# Patient Record
Sex: Female | Born: 1935 | Race: White | Hispanic: No | Marital: Single
Health system: Southern US, Community
[De-identification: ages and names within clinical notes are randomized; demographics above are authoritative.]

---

## 2009-01-04 ENCOUNTER — Inpatient Hospital Stay: Payer: Self-pay | Admitting: Internal Medicine

## 2009-01-14 ENCOUNTER — Inpatient Hospital Stay: Payer: Self-pay | Admitting: Specialist

## 2010-02-07 ENCOUNTER — Ambulatory Visit: Payer: Self-pay | Admitting: Family Medicine

## 2010-05-16 IMAGING — CT CT CHEST W/ CM
1 series · 15 of 32 positions shown, 19 images · non-contrast
Comparison: none

REASON FOR EXAM: Prior left upper lobe collapse with mucous plugging, now
with worsening cp/sob
COMMENTS:

[Series 2: soft tissue · axial · 0.61mm/px · z∈[-386,-140]mm · 15 of 55 slices shown, 19 images]
[im 4/55  soft-tissue]
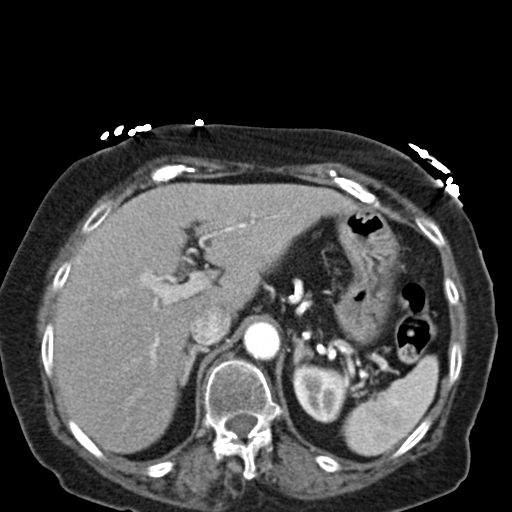
[im 4/55  bone]
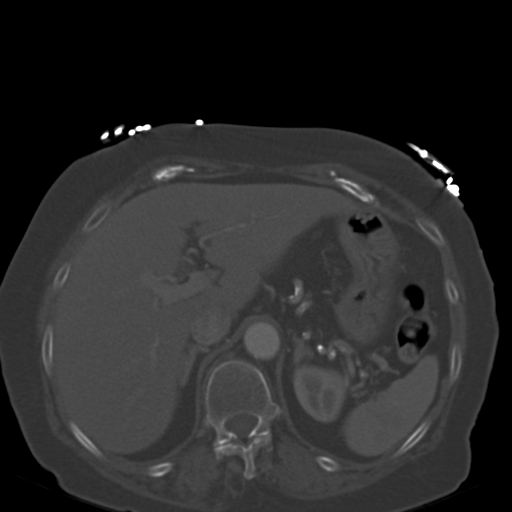
[im 7/55  soft-tissue]
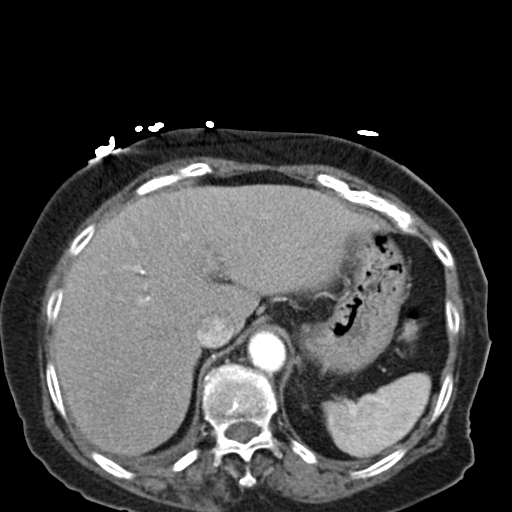
[im 11/55  soft-tissue]
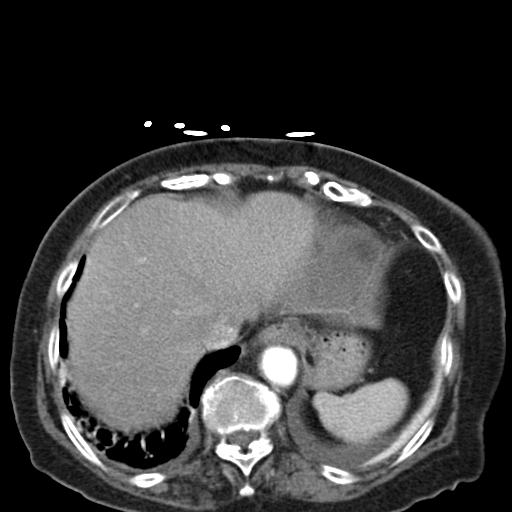
[im 16/55  soft-tissue]
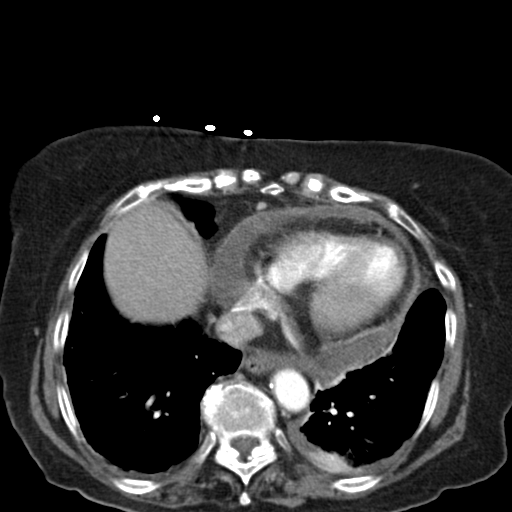
[im 20/55  soft-tissue]
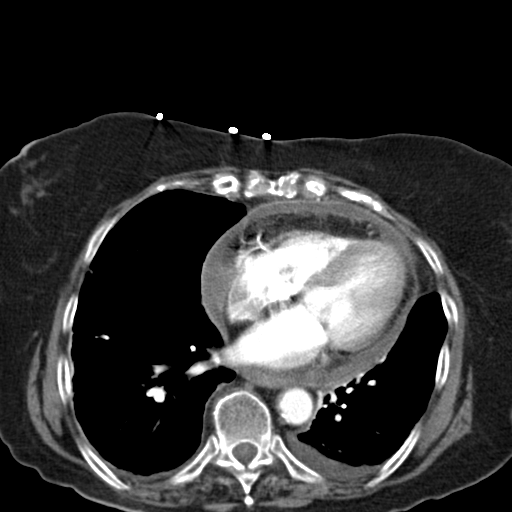
[im 23/55  soft-tissue]
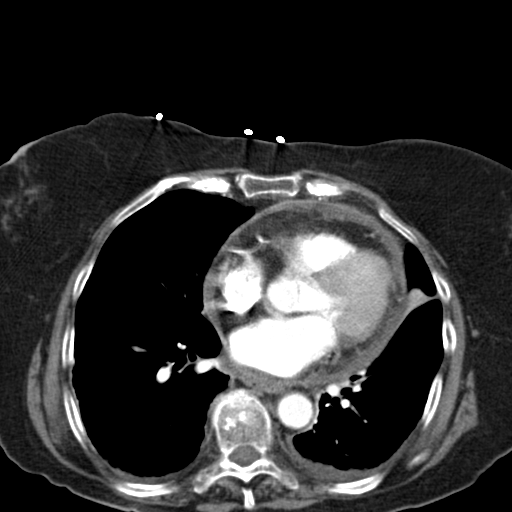
[im 28/55  soft-tissue]
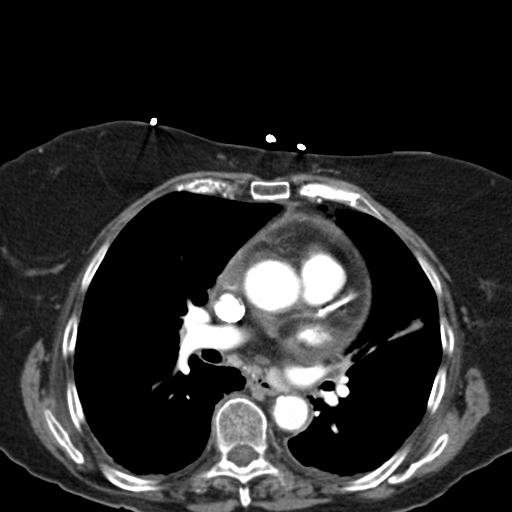
[im 32/55  soft-tissue]
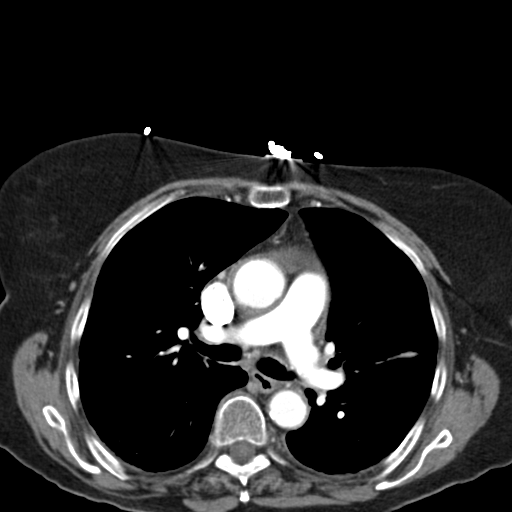
[im 35/55  soft-tissue]
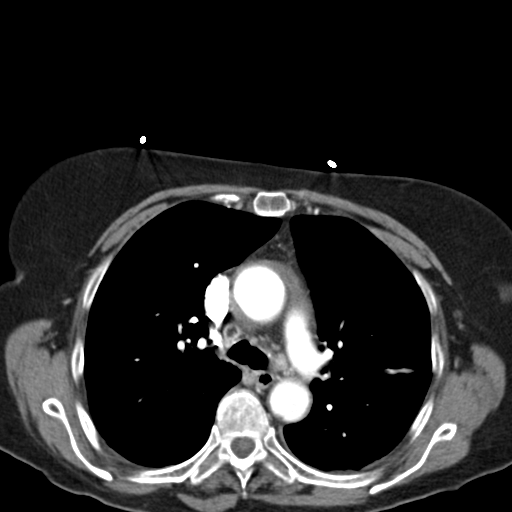
[im 35/55  bone]
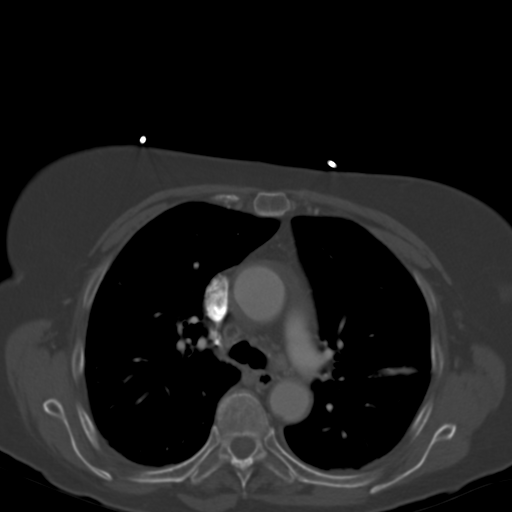
[im 39/55  soft-tissue]
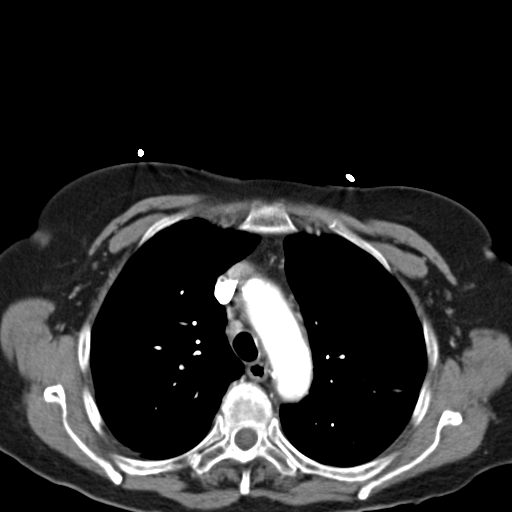
[im 44/55  soft-tissue]
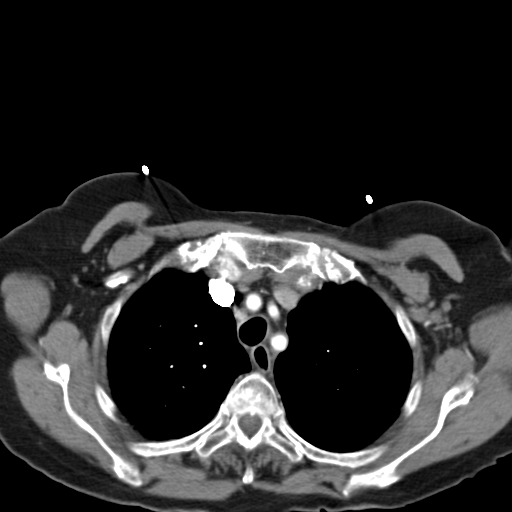
[im 48/55  soft-tissue]
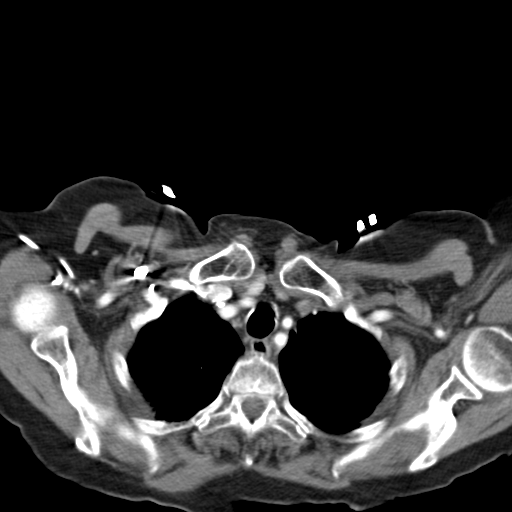
[im 48/55  lung]
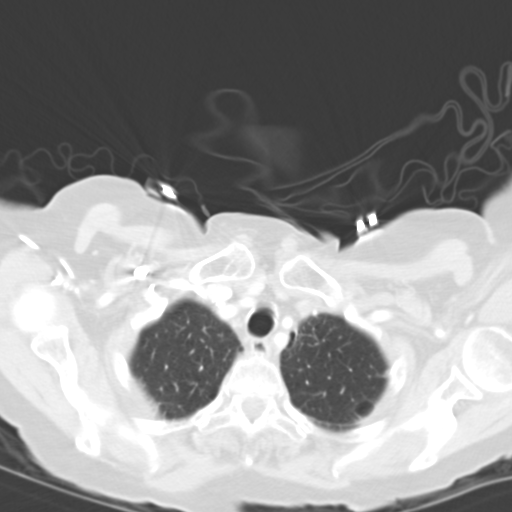
[im 49/55  lung]
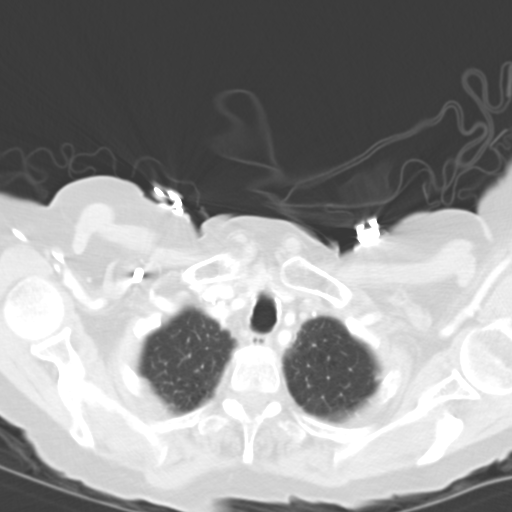
[im 51/55  soft-tissue]
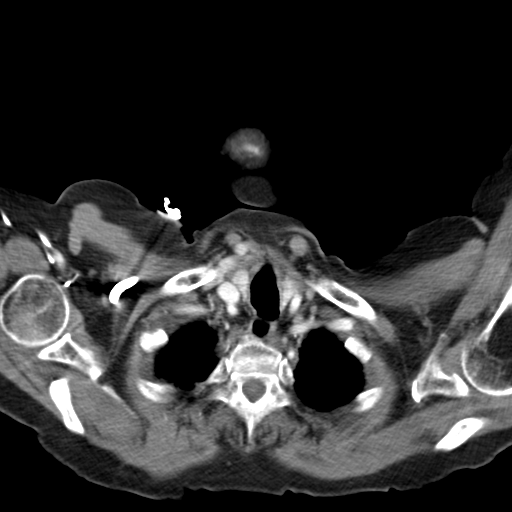
[im 51/55  lung]
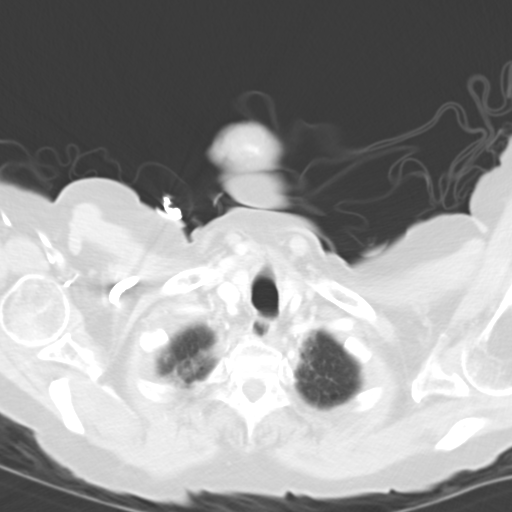
[im 53/55  lung]
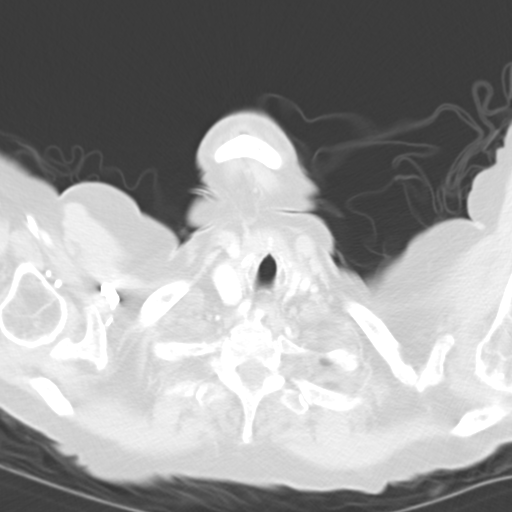

[15 of 32 positions shown; findings below may reference images not displayed]

PROCEDURE:     CT  - CT CHEST WITH CONTRAST  - January 14, 2009  [DATE]

RESULT:     CT of the chest is performed utilizing 75 mL of Psovue-H9N
iodinated intravenous contrast. Images are reconstructed in the axial plane
at 5 mm slice thickness. There is pericardial and left pleural effusion. In
the dependent portion near the left ventricle the pericardial effusion
measures is much as 11.5 mm in thickness. Anteriorly the thickness is a
proximally 6.6 mm in thickness. There some compressive atelectasis in the
left lower lobe. There is a trace pleural effusion on the right. There is no
pulmonary and bullous and demonstrated. There is no aortic dissection. There
is no mediastinal or hilar mass or adenopathy. The previous lingular
atelectasis and left upper lobe atelectasis have resolved.
IMPRESSION: 1. Pericardial effusion. Small left and trace right pleural effusions. Some
areas of compressive atelectasis are present. There is no pulmonary
embolism. There is some residual left upper lobe atelectasis which is
improved since the previous exam.

## 2011-02-10 ENCOUNTER — Ambulatory Visit: Payer: Self-pay | Admitting: Family Medicine

## 2011-06-03 ENCOUNTER — Inpatient Hospital Stay (HOSPITAL_COMMUNITY): Admission: AD | Admit: 2011-06-03 | Payer: Self-pay | Source: Other Acute Inpatient Hospital | Admitting: Neurology

## 2011-06-03 ENCOUNTER — Emergency Department: Payer: Self-pay | Admitting: Unknown Physician Specialty

## 2011-06-07 ENCOUNTER — Encounter: Payer: Self-pay | Admitting: Internal Medicine

## 2011-06-17 ENCOUNTER — Encounter: Payer: Self-pay | Admitting: Internal Medicine

## 2011-07-20 ENCOUNTER — Emergency Department: Payer: Self-pay | Admitting: Emergency Medicine

## 2011-07-21 LAB — URINALYSIS, COMPLETE
Glucose,UR: NEGATIVE mg/dL (ref 0–75)
Nitrite: NEGATIVE
RBC,UR: 1 /HPF (ref 0–5)
Specific Gravity: 1.02 (ref 1.003–1.030)
Squamous Epithelial: 4
WBC UR: 10 /HPF (ref 0–5)

## 2011-07-21 LAB — CBC WITH DIFFERENTIAL/PLATELET
Basophil #: 0 10*3/uL (ref 0.0–0.1)
Basophil %: 0.3 %
HGB: 11.9 g/dL — ABNORMAL LOW (ref 12.0–16.0)
Lymphocyte #: 1.9 10*3/uL (ref 1.0–3.6)
MCV: 98 fL (ref 80–100)
Monocyte %: 7.8 %
Neutrophil #: 8.1 10*3/uL — ABNORMAL HIGH (ref 1.4–6.5)
Platelet: 221 10*3/uL (ref 150–440)
RBC: 3.61 10*6/uL — ABNORMAL LOW (ref 3.80–5.20)
RDW: 14.7 % — ABNORMAL HIGH (ref 11.5–14.5)
WBC: 11.1 10*3/uL — ABNORMAL HIGH (ref 3.6–11.0)

## 2011-07-21 LAB — COMPREHENSIVE METABOLIC PANEL
Alkaline Phosphatase: 61 U/L (ref 50–136)
Anion Gap: 13 (ref 7–16)
Bilirubin,Total: 0.3 mg/dL (ref 0.2–1.0)
Calcium, Total: 9.4 mg/dL (ref 8.5–10.1)
Chloride: 103 mmol/L (ref 98–107)
Co2: 21 mmol/L (ref 21–32)
Creatinine: 0.9 mg/dL (ref 0.60–1.30)
EGFR (African American): >60
EGFR (Non-African Amer.): >60
Osmolality: 279 (ref 275–301)
Sodium: 137 mmol/L (ref 136–145)

## 2011-07-21 LAB — PROTIME-INR
INR: 1
Prothrombin Time: 13.7 secs (ref 11.5–14.7)

## 2011-07-21 LAB — CK TOTAL AND CKMB (NOT AT ARMC): CK-MB: 0.7 ng/mL (ref 0.5–3.6)

## 2011-07-23 ENCOUNTER — Emergency Department: Payer: Self-pay | Admitting: Emergency Medicine

## 2011-07-23 LAB — CBC
HCT: 35.6 % (ref 35.0–47.0)
HGB: 11.9 g/dL — ABNORMAL LOW (ref 12.0–16.0)
MCV: 97 fL (ref 80–100)
RBC: 3.66 10*6/uL — ABNORMAL LOW (ref 3.80–5.20)
RDW: 14.7 % — ABNORMAL HIGH (ref 11.5–14.5)
WBC: 10.8 10*3/uL (ref 3.6–11.0)

## 2011-07-23 LAB — COMPREHENSIVE METABOLIC PANEL
Albumin: 3.8 g/dL (ref 3.4–5.0)
Alkaline Phosphatase: 66 U/L (ref 50–136)
Bilirubin,Total: 0.3 mg/dL (ref 0.2–1.0)
Calcium, Total: 9.3 mg/dL (ref 8.5–10.1)
Co2: 26 mmol/L (ref 21–32)
Creatinine: 0.81 mg/dL (ref 0.60–1.30)
EGFR (African American): >60
Glucose: 106 mg/dL — ABNORMAL HIGH (ref 65–99)
SGOT(AST): 26 U/L (ref 15–37)
SGPT (ALT): 24 U/L
Sodium: 138 mmol/L (ref 136–145)
Total Protein: 6.8 g/dL (ref 6.4–8.2)

## 2011-07-23 LAB — URINALYSIS, COMPLETE
Bilirubin,UR: NEGATIVE
Blood: NEGATIVE
Glucose,UR: NEGATIVE mg/dL (ref 0–75)
Nitrite: NEGATIVE
Protein: 30

## 2011-07-26 ENCOUNTER — Encounter: Payer: Self-pay | Admitting: Internal Medicine

## 2011-07-26 LAB — URINALYSIS, COMPLETE
Blood: NEGATIVE
Glucose,UR: NEGATIVE mg/dL (ref 0–75)
Leukocyte Esterase: NEGATIVE
Nitrite: NEGATIVE
Ph: 5 (ref 4.5–8.0)
Protein: 30
Specific Gravity: 1.026 (ref 1.003–1.030)
WBC UR: 9 /HPF (ref 0–5)

## 2011-07-27 LAB — THEOPHYLLINE LEVEL: Theophylline: 9.3 ug/mL — ABNORMAL LOW (ref 10.0–20.0)

## 2011-07-28 LAB — URINE CULTURE

## 2011-08-14 ENCOUNTER — Ambulatory Visit: Payer: Self-pay | Admitting: Internal Medicine

## 2011-08-18 ENCOUNTER — Encounter: Payer: Self-pay | Admitting: Internal Medicine

## 2012-05-17 ENCOUNTER — Ambulatory Visit: Payer: Self-pay | Admitting: Internal Medicine

## 2012-06-01 ENCOUNTER — Inpatient Hospital Stay: Payer: Self-pay | Admitting: Family Medicine

## 2012-06-01 LAB — CBC WITH DIFFERENTIAL/PLATELET
Basophil #: 0.1 10*3/uL (ref 0.0–0.1)
Basophil %: 0.5 %
Eosinophil #: 0.1 10*3/uL (ref 0.0–0.7)
Eosinophil %: 0.2 %
Eosinophil %: 0.2 %
HCT: 34.5 % — ABNORMAL LOW (ref 35.0–47.0)
HGB: 14 g/dL (ref 12.0–16.0)
Lymphocyte #: 5 10*3/uL — ABNORMAL HIGH (ref 1.0–3.6)
Lymphocyte #: 0.7 10*3/uL — ABNORMAL LOW (ref 1.0–3.6)
Lymphocyte %: 4.4 %
MCH: 30.2 pg (ref 26.0–34.0)
MCV: 91 fL (ref 80–100)
Monocyte #: 1.3 x10 3/mm — ABNORMAL HIGH (ref 0.2–0.9)
Monocyte #: 1.4 x10 3/mm — ABNORMAL HIGH (ref 0.2–0.9)
Monocyte %: 8.2 %
Neutrophil %: 72.8 %
Neutrophil %: 86.8 %
Platelet: 306 10*3/uL (ref 150–440)
Platelet: 229 10*3/uL (ref 150–440)
RBC: 4.65 10*6/uL (ref 3.80–5.20)
RBC: 3.81 10*6/uL (ref 3.80–5.20)
RDW: 17 % — ABNORMAL HIGH (ref 11.5–14.5)
WBC: 16.8 10*3/uL — ABNORMAL HIGH (ref 3.6–11.0)

## 2012-06-01 LAB — URINALYSIS, COMPLETE
Ph: 6 (ref 4.5–8.0)
Protein: 100
RBC,UR: 3 /HPF (ref 0–5)
Squamous Epithelial: <1
WBC UR: 5 /HPF (ref 0–5)

## 2012-06-01 LAB — COMPREHENSIVE METABOLIC PANEL
Albumin: 4.6 g/dL (ref 3.4–5.0)
Albumin: 3.4 g/dL (ref 3.4–5.0)
BUN: 21 mg/dL — ABNORMAL HIGH (ref 7–18)
Bilirubin,Total: 0.5 mg/dL (ref 0.2–1.0)
Bilirubin,Total: 0.5 mg/dL (ref 0.2–1.0)
Chloride: 105 mmol/L (ref 98–107)
Co2: 21 mmol/L (ref 21–32)
Co2: 22 mmol/L (ref 21–32)
Creatinine: 0.86 mg/dL (ref 0.60–1.30)
Creatinine: 1.32 mg/dL — ABNORMAL HIGH (ref 0.60–1.30)
EGFR (African American): >60
EGFR (African American): 43 — ABNORMAL LOW
EGFR (Non-African Amer.): >60
EGFR (Non-African Amer.): 37 — ABNORMAL LOW
Glucose: 139 mg/dL — ABNORMAL HIGH (ref 65–99)
Osmolality: 289 (ref 275–301)
Osmolality: 277 (ref 275–301)
Potassium: 2.8 mmol/L — ABNORMAL LOW (ref 3.5–5.1)
SGOT(AST): 21 U/L (ref 15–37)
Sodium: 141 mmol/L (ref 136–145)
Sodium: 136 mmol/L (ref 136–145)
Total Protein: 6.7 g/dL (ref 6.4–8.2)

## 2012-06-01 LAB — TROPONIN I
Troponin-I: 0.08 ng/mL — ABNORMAL HIGH
Troponin-I: 0.1 ng/mL — ABNORMAL HIGH

## 2012-06-01 LAB — CK TOTAL AND CKMB (NOT AT ARMC): CK-MB: 2 ng/mL (ref 0.5–3.6)

## 2012-06-02 DIAGNOSIS — I10 Essential (primary) hypertension: Secondary | ICD-10-CM

## 2012-06-02 DIAGNOSIS — I4891 Unspecified atrial fibrillation: Secondary | ICD-10-CM

## 2012-06-02 LAB — BASIC METABOLIC PANEL
Anion Gap: 11 (ref 7–16)
BUN: 25 mg/dL — ABNORMAL HIGH (ref 7–18)
Chloride: 107 mmol/L (ref 98–107)
Creatinine: 1.76 mg/dL — ABNORMAL HIGH (ref 0.60–1.30)
EGFR (African American): 30 — ABNORMAL LOW
EGFR (Non-African Amer.): 26 — ABNORMAL LOW
Glucose: 137 mg/dL — ABNORMAL HIGH (ref 65–99)
Sodium: 138 mmol/L (ref 136–145)

## 2012-06-02 LAB — CBC WITH DIFFERENTIAL/PLATELET
Basophil #: 0.1 10*3/uL (ref 0.0–0.1)
Eosinophil #: 0 10*3/uL (ref 0.0–0.7)
Eosinophil %: 0.1 %
HCT: 31.7 % — ABNORMAL LOW (ref 35.0–47.0)
Lymphocyte #: 0.5 10*3/uL — ABNORMAL LOW (ref 1.0–3.6)
Lymphocyte %: 2.8 %
MCH: 29.8 pg (ref 26.0–34.0)
MCHC: 32.8 g/dL (ref 32.0–36.0)
Monocyte #: 1.3 x10 3/mm — ABNORMAL HIGH (ref 0.2–0.9)
Neutrophil #: 17.7 10*3/uL — ABNORMAL HIGH (ref 1.4–6.5)
Neutrophil %: 90.2 %
RDW: 16.8 % — ABNORMAL HIGH (ref 11.5–14.5)
WBC: 19.6 10*3/uL — ABNORMAL HIGH (ref 3.6–11.0)

## 2012-06-03 LAB — CBC WITH DIFFERENTIAL/PLATELET
Basophil #: 0.1 10*3/uL (ref 0.0–0.1)
Basophil %: 0.4 %
Eosinophil #: 0 10*3/uL (ref 0.0–0.7)
HCT: 26.5 % — ABNORMAL LOW (ref 35.0–47.0)
HGB: 8.8 g/dL — ABNORMAL LOW (ref 12.0–16.0)
Lymphocyte #: 0.9 10*3/uL — ABNORMAL LOW (ref 1.0–3.6)
Lymphocyte %: 6.7 %
MCV: 91 fL (ref 80–100)
Monocyte #: 1 x10 3/mm — ABNORMAL HIGH (ref 0.2–0.9)
Monocyte %: 7.5 %
Neutrophil #: 11.4 10*3/uL — ABNORMAL HIGH (ref 1.4–6.5)
Neutrophil %: 85.1 %
RDW: 17.1 % — ABNORMAL HIGH (ref 11.5–14.5)
WBC: 13.4 10*3/uL — ABNORMAL HIGH (ref 3.6–11.0)

## 2012-06-03 LAB — BASIC METABOLIC PANEL
Chloride: 115 mmol/L — ABNORMAL HIGH (ref 98–107)
Co2: 17 mmol/L — ABNORMAL LOW (ref 21–32)
Creatinine: 1.79 mg/dL — ABNORMAL HIGH (ref 0.60–1.30)
EGFR (African American): 29 — ABNORMAL LOW
Osmolality: 283 (ref 275–301)
Sodium: 140 mmol/L (ref 136–145)

## 2012-06-03 LAB — URINE CULTURE

## 2012-06-04 LAB — BASIC METABOLIC PANEL
Calcium, Total: 8.3 mg/dL — ABNORMAL LOW (ref 8.5–10.1)
Chloride: 116 mmol/L — ABNORMAL HIGH (ref 98–107)
Co2: 17 mmol/L — ABNORMAL LOW (ref 21–32)
Creatinine: 1.56 mg/dL — ABNORMAL HIGH (ref 0.60–1.30)
EGFR (African American): 35 — ABNORMAL LOW
EGFR (Non-African Amer.): 30 — ABNORMAL LOW
Potassium: 4.2 mmol/L (ref 3.5–5.1)
Sodium: 141 mmol/L (ref 136–145)

## 2012-06-04 LAB — CBC WITH DIFFERENTIAL/PLATELET
Basophil %: 0.5 %
Eosinophil #: 0.2 10*3/uL (ref 0.0–0.7)
Eosinophil %: 1.7 %
HCT: 25.8 % — ABNORMAL LOW (ref 35.0–47.0)
HGB: 8.6 g/dL — ABNORMAL LOW (ref 12.0–16.0)
Lymphocyte #: 0.9 10*3/uL — ABNORMAL LOW (ref 1.0–3.6)
Lymphocyte %: 8 %
MCHC: 33.4 g/dL (ref 32.0–36.0)
MCV: 91 fL (ref 80–100)
Monocyte #: 0.7 x10 3/mm (ref 0.2–0.9)
Neutrophil #: 8.9 10*3/uL — ABNORMAL HIGH (ref 1.4–6.5)
Neutrophil %: 82.9 %
RDW: 17.2 % — ABNORMAL HIGH (ref 11.5–14.5)

## 2012-06-05 LAB — PHOSPHORUS: Phosphorus: 3.8 mg/dL (ref 2.5–4.9)

## 2012-06-06 LAB — BASIC METABOLIC PANEL
Anion Gap: 9 (ref 7–16)
Calcium, Total: 8.5 mg/dL (ref 8.5–10.1)
Chloride: 117 mmol/L — ABNORMAL HIGH (ref 98–107)
Co2: 18 mmol/L — ABNORMAL LOW (ref 21–32)
Creatinine: 1.47 mg/dL — ABNORMAL HIGH (ref 0.60–1.30)
Osmolality: 293 (ref 275–301)
Potassium: 5 mmol/L (ref 3.5–5.1)

## 2012-06-06 LAB — CBC WITH DIFFERENTIAL/PLATELET
Lymphocyte #: 0.7 10*3/uL — ABNORMAL LOW (ref 1.0–3.6)
MCV: 91 fL (ref 80–100)
Monocyte #: 0.6 x10 3/mm (ref 0.2–0.9)
Monocyte %: 7 %
Neutrophil #: 7.8 10*3/uL — ABNORMAL HIGH (ref 1.4–6.5)
Neutrophil %: 84.5 %
Platelet: 217 10*3/uL (ref 150–440)
RBC: 2.86 10*6/uL — ABNORMAL LOW (ref 3.80–5.20)
RDW: 16.9 % — ABNORMAL HIGH (ref 11.5–14.5)
WBC: 9.3 10*3/uL (ref 3.6–11.0)

## 2012-06-08 LAB — BASIC METABOLIC PANEL
BUN: 39 mg/dL — ABNORMAL HIGH (ref 7–18)
Chloride: 119 mmol/L — ABNORMAL HIGH (ref 98–107)
Co2: 19 mmol/L — ABNORMAL LOW (ref 21–32)
Creatinine: 1.17 mg/dL (ref 0.60–1.30)
EGFR (African American): 49 — ABNORMAL LOW
EGFR (Non-African Amer.): 42 — ABNORMAL LOW
Osmolality: 298 (ref 275–301)
Potassium: 3.8 mmol/L (ref 3.5–5.1)
Sodium: 146 mmol/L — ABNORMAL HIGH (ref 136–145)

## 2012-06-08 LAB — CBC WITH DIFFERENTIAL/PLATELET
Basophil #: 0.1 10*3/uL (ref 0.0–0.1)
Eosinophil #: 0.2 10*3/uL (ref 0.0–0.7)
Eosinophil %: 1.8 %
HCT: 26 % — ABNORMAL LOW (ref 35.0–47.0)
Lymphocyte #: 1.1 10*3/uL (ref 1.0–3.6)
MCV: 91 fL (ref 80–100)
Monocyte #: 0.8 x10 3/mm (ref 0.2–0.9)
Platelet: 239 10*3/uL (ref 150–440)
RDW: 16.7 % — ABNORMAL HIGH (ref 11.5–14.5)
WBC: 9.2 10*3/uL (ref 3.6–11.0)

## 2012-06-08 LAB — EXPECTORATED SPUTUM ASSESSMENT W GRAM STAIN, RFLX TO RESP C

## 2012-06-09 LAB — OCCULT BLOOD X 1 CARD TO LAB, STOOL: Occult Blood, Feces: NEGATIVE

## 2012-06-10 LAB — LIPID PANEL
Cholesterol: 131 mg/dL (ref 0–200)
HDL Cholesterol: 32 mg/dL — ABNORMAL LOW (ref 40–60)
Triglycerides: 202 mg/dL — ABNORMAL HIGH (ref 0–200)

## 2012-06-11 LAB — CBC WITH DIFFERENTIAL/PLATELET
Basophil #: 0.1 10*3/uL (ref 0.0–0.1)
Eosinophil #: 0.1 10*3/uL (ref 0.0–0.7)
HCT: 25.6 % — ABNORMAL LOW (ref 35.0–47.0)
Lymphocyte #: 0.9 10*3/uL — ABNORMAL LOW (ref 1.0–3.6)
MCH: 29.2 pg (ref 26.0–34.0)
MCHC: 32.3 g/dL (ref 32.0–36.0)
MCV: 90 fL (ref 80–100)
Monocyte #: 0.8 x10 3/mm (ref 0.2–0.9)
Neutrophil #: 11.4 10*3/uL — ABNORMAL HIGH (ref 1.4–6.5)
Neutrophil %: 86.2 %
RBC: 2.83 10*6/uL — ABNORMAL LOW (ref 3.80–5.20)
RDW: 16.7 % — ABNORMAL HIGH (ref 11.5–14.5)

## 2012-06-11 LAB — BASIC METABOLIC PANEL
Anion Gap: 10 (ref 7–16)
BUN: 29 mg/dL — ABNORMAL HIGH (ref 7–18)
BUN: 28 mg/dL — ABNORMAL HIGH (ref 7–18)
Chloride: 120 mmol/L — ABNORMAL HIGH (ref 98–107)
Chloride: 121 mmol/L — ABNORMAL HIGH (ref 98–107)
Co2: 21 mmol/L (ref 21–32)
Co2: 23 mmol/L (ref 21–32)
Creatinine: 0.89 mg/dL (ref 0.60–1.30)
EGFR (African American): >60
EGFR (African American): >60
EGFR (Non-African Amer.): >60
Glucose: 121 mg/dL — ABNORMAL HIGH (ref 65–99)
Osmolality: 307 (ref 275–301)
Potassium: 2.6 mmol/L — ABNORMAL LOW (ref 3.5–5.1)
Sodium: 150 mmol/L — ABNORMAL HIGH (ref 136–145)

## 2012-06-12 ENCOUNTER — Encounter: Payer: Self-pay | Admitting: Internal Medicine

## 2012-06-12 LAB — BASIC METABOLIC PANEL
Anion Gap: 6 — ABNORMAL LOW (ref 7–16)
BUN: 22 mg/dL — ABNORMAL HIGH (ref 7–18)
Calcium, Total: 8.5 mg/dL (ref 8.5–10.1)
Chloride: 114 mmol/L — ABNORMAL HIGH (ref 98–107)
Co2: 26 mmol/L (ref 21–32)
EGFR (African American): >60
Sodium: 146 mmol/L — ABNORMAL HIGH (ref 136–145)

## 2012-06-12 LAB — CBC WITH DIFFERENTIAL/PLATELET
Basophil #: 0 10*3/uL (ref 0.0–0.1)
Basophil %: 0.2 %
HCT: 24.7 % — ABNORMAL LOW (ref 35.0–47.0)
HGB: 8.7 g/dL — ABNORMAL LOW (ref 12.0–16.0)
Lymphocyte #: 0.4 10*3/uL — ABNORMAL LOW (ref 1.0–3.6)
Lymphocyte %: 2.9 %
MCHC: 35.2 g/dL (ref 32.0–36.0)
MCV: 89 fL (ref 80–100)
Monocyte #: 0.3 x10 3/mm (ref 0.2–0.9)
Monocyte %: 2.1 %
Neutrophil #: 13.8 10*3/uL — ABNORMAL HIGH (ref 1.4–6.5)
Neutrophil %: 94.8 %
Platelet: 222 10*3/uL (ref 150–440)
RDW: 17 % — ABNORMAL HIGH (ref 11.5–14.5)
WBC: 14.5 10*3/uL — ABNORMAL HIGH (ref 3.6–11.0)

## 2012-06-12 LAB — MAGNESIUM: Magnesium: 1.3 mg/dL — ABNORMAL LOW

## 2012-06-12 LAB — PRO B NATRIURETIC PEPTIDE: B-Type Natriuretic Peptide: 8138 pg/mL — ABNORMAL HIGH (ref 0–450)

## 2012-06-13 LAB — CBC WITH DIFFERENTIAL/PLATELET
Basophil #: 0.1 10*3/uL (ref 0.0–0.1)
Basophil %: 0.6 %
Eosinophil #: 0.2 10*3/uL (ref 0.0–0.7)
Lymphocyte #: 1.4 10*3/uL (ref 1.0–3.6)
MCHC: 32.8 g/dL (ref 32.0–36.0)
MCV: 90 fL (ref 80–100)
Monocyte %: 3.8 %
Neutrophil #: 10.4 10*3/uL — ABNORMAL HIGH (ref 1.4–6.5)
Platelet: 216 10*3/uL (ref 150–440)
RBC: 2.6 10*6/uL — ABNORMAL LOW (ref 3.80–5.20)
RDW: 17 % — ABNORMAL HIGH (ref 11.5–14.5)
WBC: 12.6 10*3/uL — ABNORMAL HIGH (ref 3.6–11.0)

## 2012-06-13 LAB — BASIC METABOLIC PANEL
Anion Gap: 7 (ref 7–16)
BUN: 25 mg/dL — ABNORMAL HIGH (ref 7–18)
Chloride: 113 mmol/L — ABNORMAL HIGH (ref 98–107)
Co2: 25 mmol/L (ref 21–32)
Creatinine: 0.96 mg/dL (ref 0.60–1.30)
Potassium: 3.1 mmol/L — ABNORMAL LOW (ref 3.5–5.1)
Sodium: 145 mmol/L (ref 136–145)

## 2012-06-13 LAB — IRON AND TIBC: Iron: 26 ug/dL — ABNORMAL LOW (ref 50–170)

## 2012-06-13 LAB — FERRITIN: Ferritin (ARMC): 208 ng/mL (ref 8–388)

## 2012-06-14 LAB — CBC WITH DIFFERENTIAL/PLATELET
Basophil #: 0.1 10*3/uL (ref 0.0–0.1)
Basophil %: 0.6 %
Eosinophil #: 0.4 10*3/uL (ref 0.0–0.7)
HGB: 8.1 g/dL — ABNORMAL LOW (ref 12.0–16.0)
Lymphocyte #: 1.3 10*3/uL (ref 1.0–3.6)
Lymphocyte %: 11.8 %
MCHC: 33.5 g/dL (ref 32.0–36.0)
Neutrophil %: 79.2 %
Platelet: 214 10*3/uL (ref 150–440)
RBC: 2.67 10*6/uL — ABNORMAL LOW (ref 3.80–5.20)
RDW: 17.4 % — ABNORMAL HIGH (ref 11.5–14.5)
WBC: 10.7 10*3/uL (ref 3.6–11.0)

## 2012-06-14 LAB — BASIC METABOLIC PANEL
Anion Gap: 6 — ABNORMAL LOW (ref 7–16)
Calcium, Total: 8.4 mg/dL — ABNORMAL LOW (ref 8.5–10.1)
Chloride: 114 mmol/L — ABNORMAL HIGH (ref 98–107)
Co2: 26 mmol/L (ref 21–32)
Creatinine: 0.91 mg/dL (ref 0.60–1.30)
EGFR (Non-African Amer.): 58 — ABNORMAL LOW
Potassium: 3.7 mmol/L (ref 3.5–5.1)

## 2012-06-14 LAB — MAGNESIUM: Magnesium: 1.4 mg/dL — ABNORMAL LOW

## 2012-06-16 ENCOUNTER — Ambulatory Visit: Payer: Self-pay | Admitting: Internal Medicine

## 2012-06-16 ENCOUNTER — Encounter: Payer: Self-pay | Admitting: Internal Medicine

## 2012-07-17 ENCOUNTER — Encounter: Payer: Self-pay | Admitting: Internal Medicine

## 2012-08-17 ENCOUNTER — Encounter: Payer: Self-pay | Admitting: Internal Medicine

## 2012-09-14 ENCOUNTER — Encounter: Payer: Self-pay | Admitting: Internal Medicine

## 2012-09-19 LAB — BASIC METABOLIC PANEL
Anion Gap: 7 (ref 7–16)
BUN: 36 mg/dL — ABNORMAL HIGH (ref 7–18)
Calcium, Total: 8.9 mg/dL (ref 8.5–10.1)
Chloride: 108 mmol/L — ABNORMAL HIGH (ref 98–107)
EGFR (Non-African Amer.): 51 — ABNORMAL LOW
Osmolality: 284 (ref 275–301)
Potassium: 4.3 mmol/L (ref 3.5–5.1)

## 2012-09-19 LAB — CBC WITH DIFFERENTIAL/PLATELET
Basophil %: 0.8 %
Eosinophil %: 2 %
HCT: 29.3 % — ABNORMAL LOW (ref 35.0–47.0)
HGB: 9.7 g/dL — ABNORMAL LOW (ref 12.0–16.0)
Lymphocyte #: 1.7 10*3/uL (ref 1.0–3.6)
MCH: 31 pg (ref 26.0–34.0)
MCHC: 33.3 g/dL (ref 32.0–36.0)
MCV: 93 fL (ref 80–100)
Monocyte %: 8.4 %
Neutrophil #: 8.1 10*3/uL — ABNORMAL HIGH (ref 1.4–6.5)
Neutrophil %: 73 %
RBC: 3.15 10*6/uL — ABNORMAL LOW (ref 3.80–5.20)

## 2012-10-01 LAB — LIPID PANEL
Cholesterol: 138 mg/dL (ref 0–200)
Ldl Cholesterol, Calc: 58 mg/dL (ref 0–100)
Triglycerides: 165 mg/dL (ref 0–200)

## 2012-10-15 ENCOUNTER — Encounter: Payer: Self-pay | Admitting: Internal Medicine

## 2012-10-19 LAB — URINALYSIS, COMPLETE
RBC,UR: >30 /HPF (ref 0–5)
Specific Gravity: 1.02 (ref 1.003–1.030)
Squamous Epithelial: NONE SEEN
WBC UR: >30 /HPF (ref 0–5)

## 2012-10-21 LAB — URINE CULTURE

## 2012-11-14 ENCOUNTER — Encounter: Payer: Self-pay | Admitting: Internal Medicine

## 2012-11-21 IMAGING — CT CT HEAD WITHOUT CONTRAST
2 series · 16 of 30 positions shown, 20 images · non-contrast
Comparison: none

REASON FOR EXAM: trouble speaking
COMMENTS:

[Series 2: without · axial · non-contrast · 0.40mm/px · z∈[-81,+39]mm · 13 of 28 slices shown, 17 images]
[im 2/28  brain]
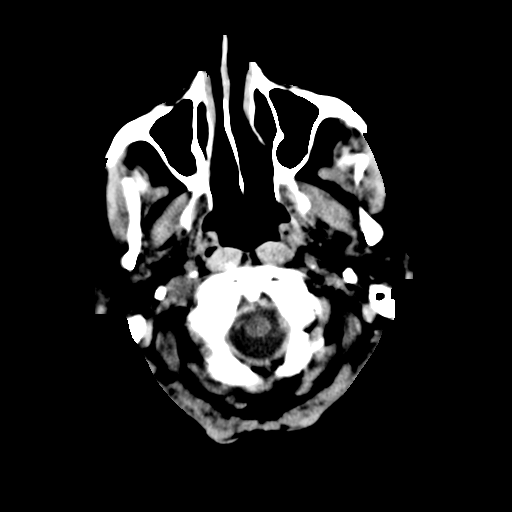
[im 2/28  bone]
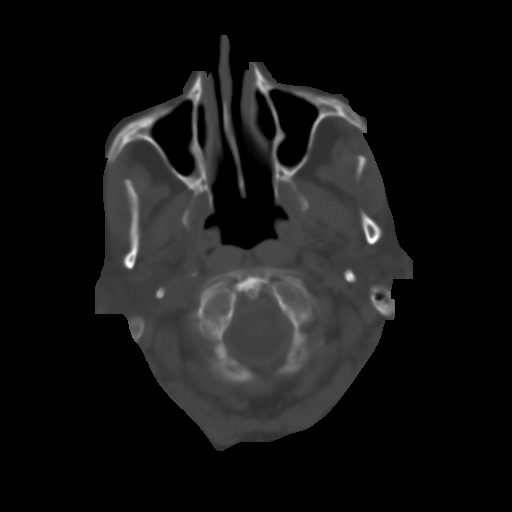
[im 4/28  brain]
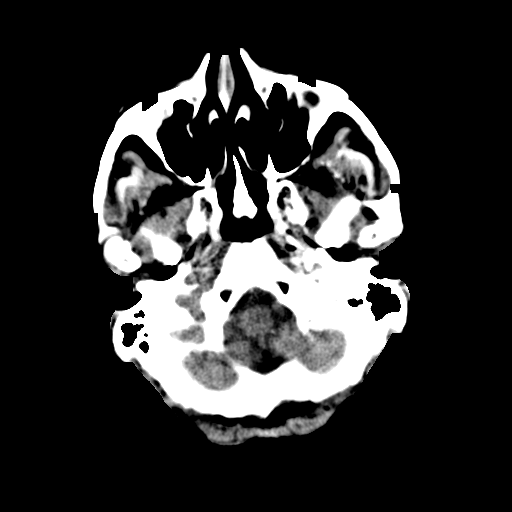
[im 6/28  brain]
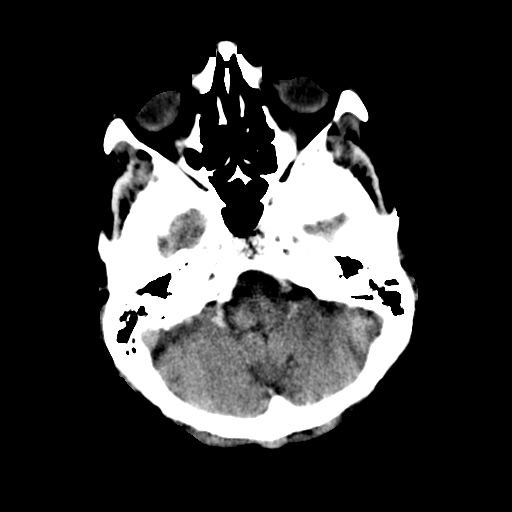
[im 8/28  brain]
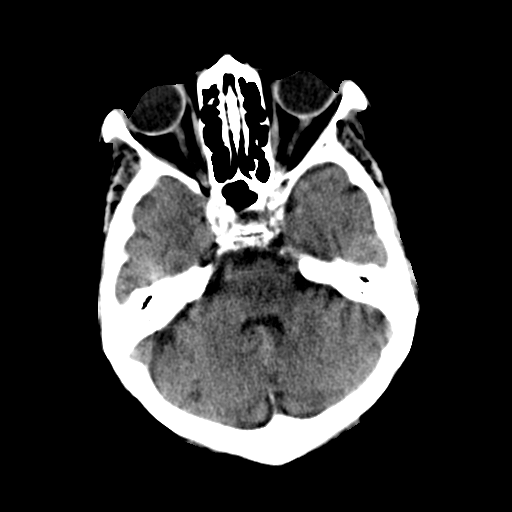
[im 10/28  brain]
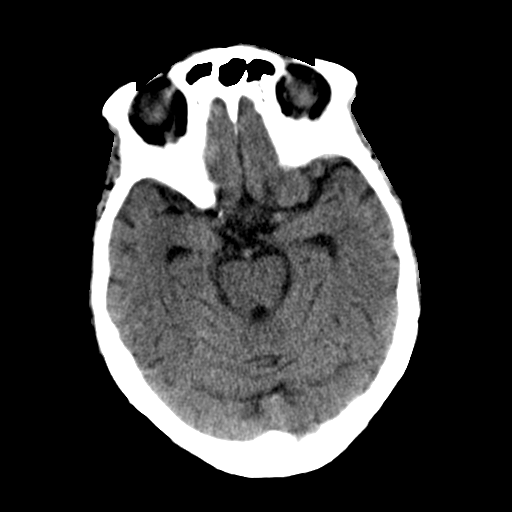
[im 10/28  bone]
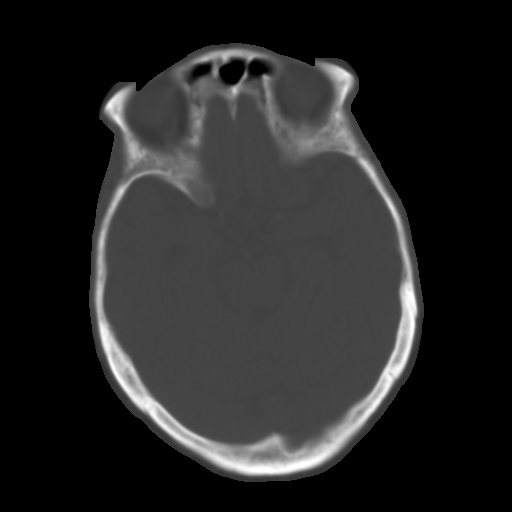
[im 12/28  brain]
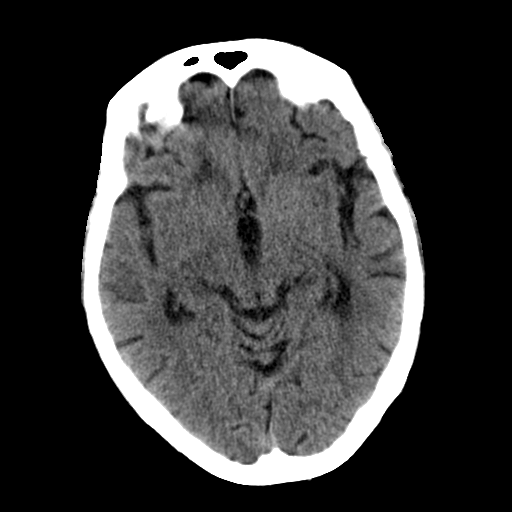
[im 14/28  brain]
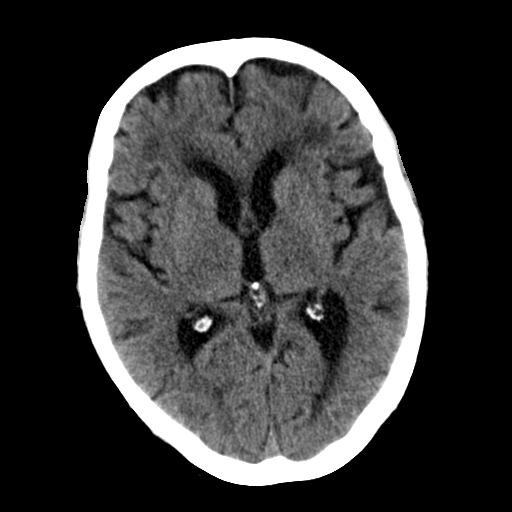
[im 16/28  brain]
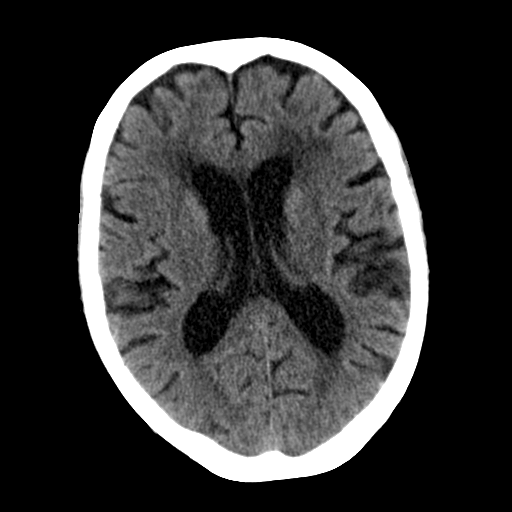
[im 18/28  brain]
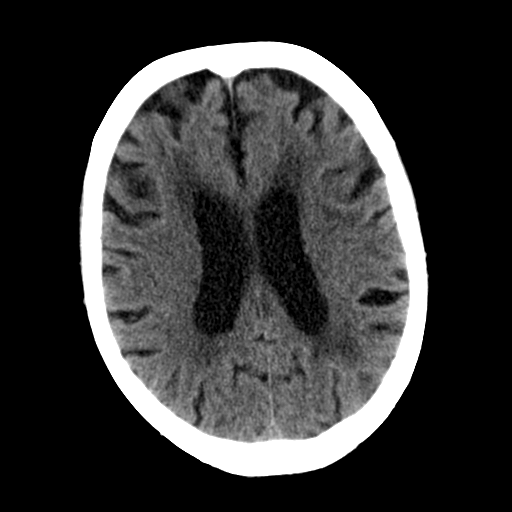
[im 18/28  bone]
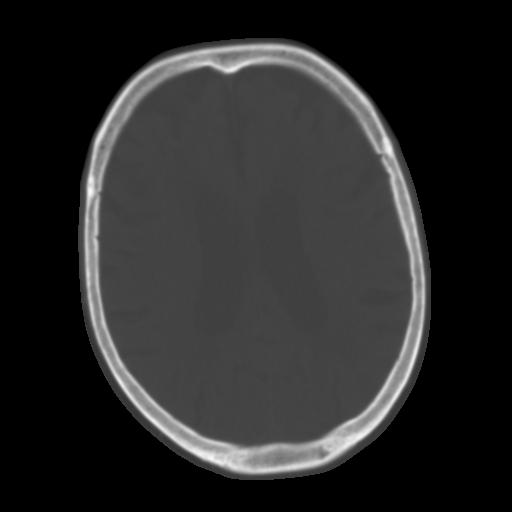
[im 20/28  brain]
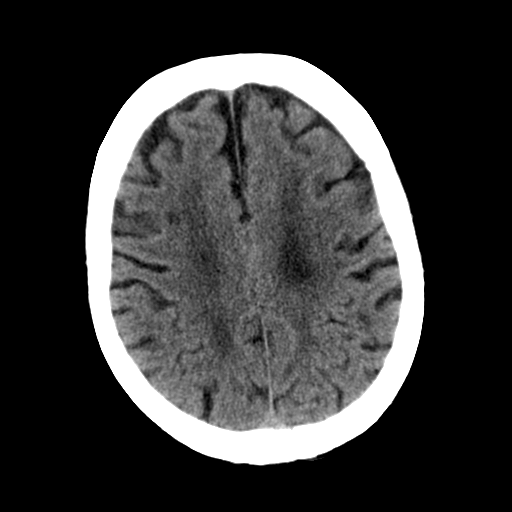
[im 22/28  brain]
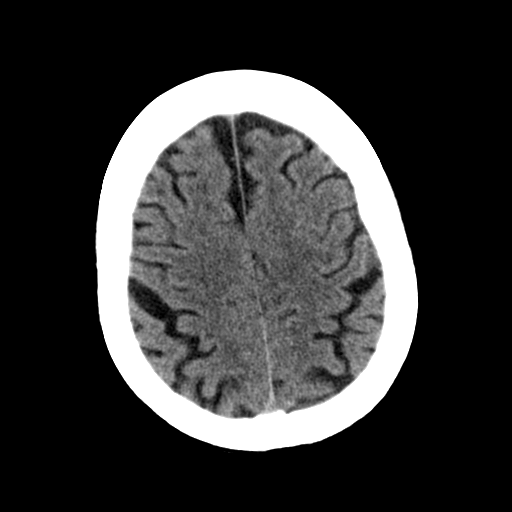
[im 24/28  brain]
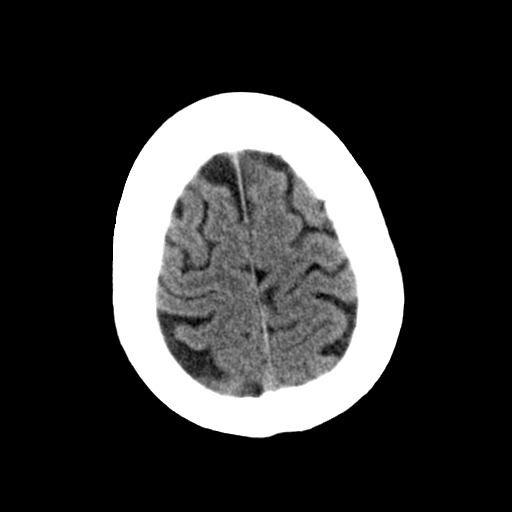
[im 26/28  brain]
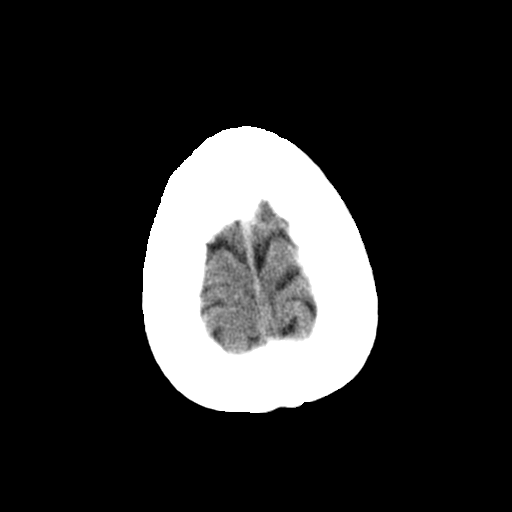
[im 26/28  bone]
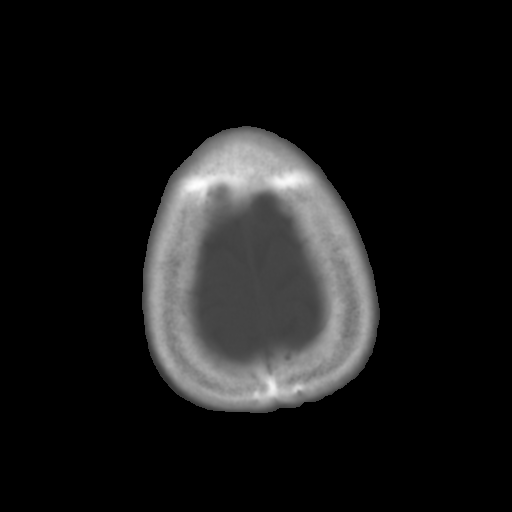

[Series 3: bone · axial · 0.40mm/px · z∈[-81,-41]mm · 3 of 28 slices shown]
[im 2/28  bone]
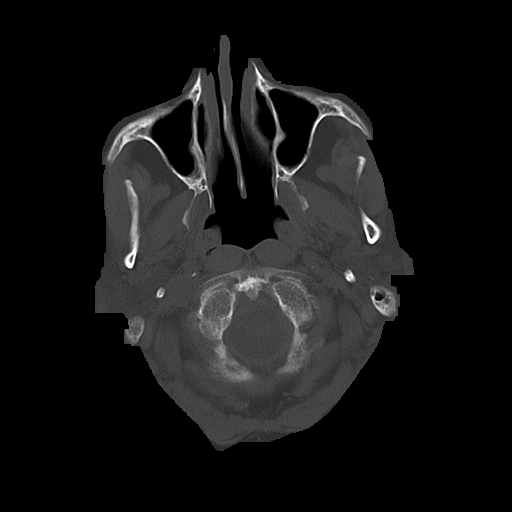
[im 6/28  bone]
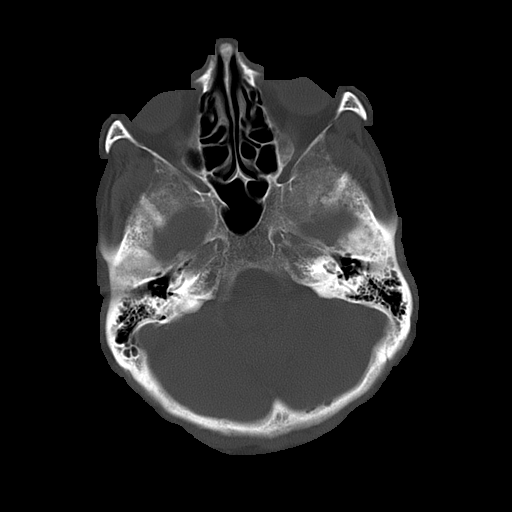
[im 10/28  bone]
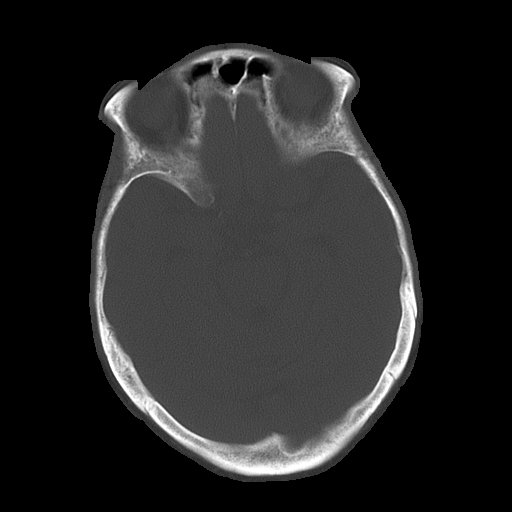

[16 of 30 positions shown; findings below may reference images not displayed]

PROCEDURE:     CT  - CT HEAD WITHOUT CONTRAST  - July 23, 2011  [DATE]

RESULT:     CT of the brain is compared to the previous exam dated for
July 2011.

There are changes of atrophy with prominence of the ventricles and sulci.
There is low-attenuation diffusely within the periventricular and
subcortical white matter most consistent with chronic small vessel ischemic
disease. There is no definite evolving infarct, intracranial hemorrhage or
mass effect. No significant interval change is appreciated. The sinuses are
clear. The mastoid air cells show normal aeration. The calvarium appears
intact.
IMPRESSION: Changes of atrophy with chronic small vessel ischemic
disease. No acute intracranial abnormality evident.

## 2012-11-22 LAB — URINALYSIS, COMPLETE
Blood: NEGATIVE
Nitrite: NEGATIVE
Protein: NEGATIVE
Specific Gravity: 1.014 (ref 1.003–1.030)
WBC UR: 40 /HPF (ref 0–5)

## 2012-11-24 LAB — URINE CULTURE

## 2012-12-15 ENCOUNTER — Encounter: Payer: Self-pay | Admitting: Internal Medicine

## 2013-01-14 ENCOUNTER — Encounter: Payer: Self-pay | Admitting: Internal Medicine

## 2013-01-30 LAB — BASIC METABOLIC PANEL
Anion Gap: 8 (ref 7–16)
BUN: 16 mg/dL (ref 7–18)
Calcium, Total: 9 mg/dL (ref 8.5–10.1)
Chloride: 108 mmol/L — ABNORMAL HIGH (ref 98–107)
Co2: 24 mmol/L (ref 21–32)
Creatinine: 0.92 mg/dL (ref 0.60–1.30)
EGFR (African American): >60
EGFR (Non-African Amer.): 56 — ABNORMAL LOW
Glucose: 111 mg/dL — ABNORMAL HIGH (ref 65–99)
Potassium: 3.8 mmol/L (ref 3.5–5.1)
Sodium: 140 mmol/L (ref 136–145)

## 2013-01-30 LAB — LIPID PANEL
Cholesterol: 137 mg/dL (ref 0–200)
HDL Cholesterol: 37 mg/dL — ABNORMAL LOW (ref 40–60)
Ldl Cholesterol, Calc: 49 mg/dL (ref 0–100)
Triglycerides: 255 mg/dL — ABNORMAL HIGH (ref 0–200)
VLDL Cholesterol, Calc: 51 mg/dL — ABNORMAL HIGH (ref 5–40)

## 2013-02-14 ENCOUNTER — Encounter: Payer: Self-pay | Admitting: Internal Medicine

## 2013-03-17 ENCOUNTER — Encounter: Payer: Self-pay | Admitting: Internal Medicine

## 2013-04-16 ENCOUNTER — Encounter: Payer: Self-pay | Admitting: Internal Medicine

## 2013-05-01 LAB — URINALYSIS, COMPLETE
Glucose,UR: NEGATIVE mg/dL (ref 0–75)
Nitrite: NEGATIVE
Ph: 5 (ref 4.5–8.0)
Protein: NEGATIVE
RBC,UR: 22 /HPF (ref 0–5)
Specific Gravity: 1.017 (ref 1.003–1.030)
Squamous Epithelial: 3
WBC UR: 341 /HPF (ref 0–5)

## 2013-05-17 ENCOUNTER — Encounter: Payer: Self-pay | Admitting: Internal Medicine

## 2013-06-16 ENCOUNTER — Encounter: Payer: Self-pay | Admitting: Internal Medicine

## 2013-07-16 LAB — URINALYSIS, COMPLETE
Bilirubin,UR: NEGATIVE
Blood: NEGATIVE
Ketone: NEGATIVE
Ph: 5 (ref 4.5–8.0)
Protein: NEGATIVE
RBC,UR: 2 /HPF (ref 0–5)
Squamous Epithelial: 2

## 2013-07-17 ENCOUNTER — Encounter: Payer: Self-pay | Admitting: Internal Medicine

## 2013-07-17 LAB — URINE CULTURE

## 2013-07-29 LAB — CBC WITH DIFFERENTIAL/PLATELET
BASOS ABS: 0.1 10*3/uL (ref 0.0–0.1)
BASOS PCT: 1.1 %
Eosinophil #: 0.2 10*3/uL (ref 0.0–0.7)
Eosinophil %: 2.7 %
HCT: 27.6 % — ABNORMAL LOW (ref 35.0–47.0)
HGB: 9.1 g/dL — ABNORMAL LOW (ref 12.0–16.0)
LYMPHS ABS: 2.2 10*3/uL (ref 1.0–3.6)
Lymphocyte %: 24.1 %
MCH: 31.4 pg (ref 26.0–34.0)
MCHC: 33.1 g/dL (ref 32.0–36.0)
MCV: 95 fL (ref 80–100)
Monocyte #: 0.8 x10 3/mm (ref 0.2–0.9)
Monocyte %: 9.5 %
Neutrophil #: 5.6 10*3/uL (ref 1.4–6.5)
Neutrophil %: 62.6 %
Platelet: 236 10*3/uL (ref 150–440)
RBC: 2.91 10*6/uL — AB (ref 3.80–5.20)
RDW: 15.7 % — AB (ref 11.5–14.5)
WBC: 8.9 10*3/uL (ref 3.6–11.0)

## 2013-07-29 LAB — HEPATIC FUNCTION PANEL A (ARMC)
ALT: 14 U/L (ref 12–78)
Albumin: 3.4 g/dL (ref 3.4–5.0)
Alkaline Phosphatase: 72 U/L
BILIRUBIN TOTAL: 0.2 mg/dL (ref 0.2–1.0)
Bilirubin, Direct: <0.1 mg/dL (ref 0.00–0.20)
SGOT(AST): 10 U/L — ABNORMAL LOW (ref 15–37)
TOTAL PROTEIN: 6.4 g/dL (ref 6.4–8.2)

## 2013-07-29 LAB — BASIC METABOLIC PANEL
ANION GAP: 6 — AB (ref 7–16)
BUN: 26 mg/dL — ABNORMAL HIGH (ref 7–18)
CALCIUM: 8.9 mg/dL (ref 8.5–10.1)
CHLORIDE: 109 mmol/L — AB (ref 98–107)
CREATININE: 1.13 mg/dL (ref 0.60–1.30)
Co2: 25 mmol/L (ref 21–32)
EGFR (African American): 51 — ABNORMAL LOW
GFR CALC NON AF AMER: 44 — AB
Glucose: 92 mg/dL (ref 65–99)
Osmolality: 284 (ref 275–301)
POTASSIUM: 4.1 mmol/L (ref 3.5–5.1)
SODIUM: 140 mmol/L (ref 136–145)

## 2013-07-29 LAB — LIPID PANEL
Cholesterol: 115 mg/dL (ref 0–200)
HDL Cholesterol: 36 mg/dL — ABNORMAL LOW (ref 40–60)
Ldl Cholesterol, Calc: 40 mg/dL (ref 0–100)
Triglycerides: 195 mg/dL (ref 0–200)
VLDL Cholesterol, Calc: 39 mg/dL (ref 5–40)

## 2013-08-05 LAB — RAPID INFLUENZA A&B ANTIGENS

## 2013-10-11 IMAGING — CR DG CHEST 1V PORT
1 series · 1 of 1 positions shown · non-contrast
Comparison: none

REASON FOR EXAM: possible aspiration
COMMENTS:

[ap]
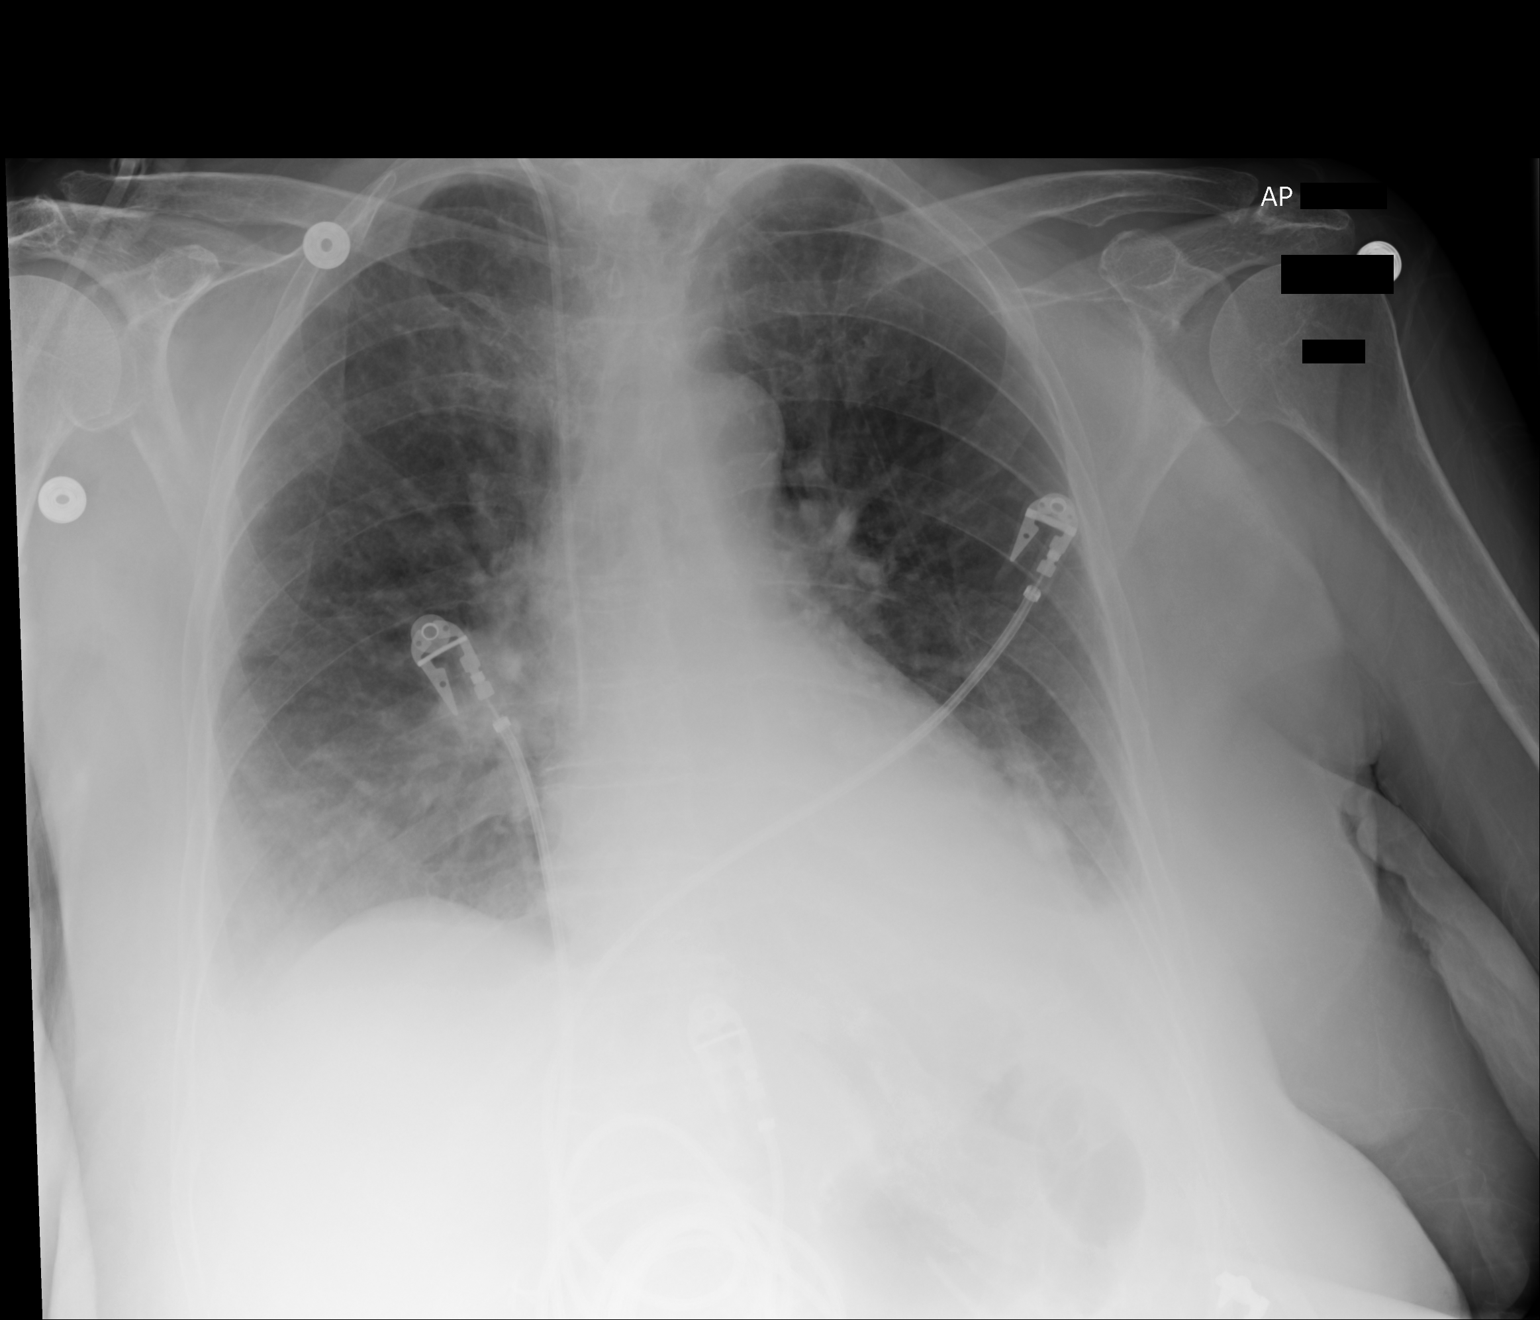

[1 of 1 positions shown; findings below may reference images not displayed]

PROCEDURE:     DXR - DXR PORTABLE CHEST SINGLE VIEW  - June 11, 2012  [DATE]

RESULT:     Comparison is made to the study dated 03 June, 2012.

There is a right internal jugular central venous catheter present with the
tip in the right atrium. There is only vascular congestion with an
appearance of mild interstitial edema. A small left pleural effusion appears
present. The heart size appears normal. Cardiac monitoring electrodes are
present. Basilar atelectasis is present bilaterally.
IMPRESSION: Internal jugular central venous catheter on the right
appears to be in good position. Mild interstitial edema with basilar
atelectasis and possible trace left pleural effusion. Early left lung base
aspiration cannot be excluded.

[REDACTED]

## 2013-11-03 ENCOUNTER — Encounter: Payer: Self-pay | Admitting: Internal Medicine

## 2013-11-04 LAB — CBC WITH DIFFERENTIAL/PLATELET
BASOS ABS: 0.1 10*3/uL (ref 0.0–0.1)
Basophil %: 0.9 %
EOS PCT: 2.5 %
Eosinophil #: 0.2 10*3/uL (ref 0.0–0.7)
HCT: 29.3 % — AB (ref 35.0–47.0)
HGB: 9.6 g/dL — AB (ref 12.0–16.0)
LYMPHS PCT: 21 %
Lymphocyte #: 2.1 10*3/uL (ref 1.0–3.6)
MCH: 32.1 pg (ref 26.0–34.0)
MCHC: 32.7 g/dL (ref 32.0–36.0)
MCV: 98 fL (ref 80–100)
Monocyte #: 0.9 x10 3/mm (ref 0.2–0.9)
Monocyte %: 8.8 %
NEUTROS PCT: 66.8 %
Neutrophil #: 6.5 10*3/uL (ref 1.4–6.5)
PLATELETS: 234 10*3/uL (ref 150–440)
RBC: 2.98 10*6/uL — ABNORMAL LOW (ref 3.80–5.20)
RDW: 16 % — AB (ref 11.5–14.5)
WBC: 9.8 10*3/uL (ref 3.6–11.0)

## 2013-11-14 ENCOUNTER — Encounter: Payer: Self-pay | Admitting: Internal Medicine

## 2013-12-15 ENCOUNTER — Encounter: Payer: Self-pay | Admitting: Internal Medicine

## 2014-01-14 ENCOUNTER — Encounter: Payer: Self-pay | Admitting: Internal Medicine

## 2014-01-29 LAB — CBC WITH DIFFERENTIAL/PLATELET
Basophil #: 0.1 10*3/uL (ref 0.0–0.1)
Basophil %: 0.9 %
Eosinophil #: 0.2 10*3/uL (ref 0.0–0.7)
Eosinophil %: 1.8 %
HCT: 32.5 % — AB (ref 35.0–47.0)
HGB: 10.4 g/dL — ABNORMAL LOW (ref 12.0–16.0)
Lymphocyte #: 2.3 10*3/uL (ref 1.0–3.6)
Lymphocyte %: 16.9 %
MCH: 31.9 pg (ref 26.0–34.0)
MCHC: 32 g/dL (ref 32.0–36.0)
MCV: 100 fL (ref 80–100)
MONO ABS: 0.9 x10 3/mm (ref 0.2–0.9)
Monocyte %: 7 %
NEUTROS PCT: 73.4 %
Neutrophil #: 9.8 10*3/uL — ABNORMAL HIGH (ref 1.4–6.5)
Platelet: 285 10*3/uL (ref 150–440)
RBC: 3.26 10*6/uL — ABNORMAL LOW (ref 3.80–5.20)
RDW: 16.1 % — ABNORMAL HIGH (ref 11.5–14.5)
WBC: 13.4 10*3/uL — ABNORMAL HIGH (ref 3.6–11.0)

## 2014-01-29 LAB — COMPREHENSIVE METABOLIC PANEL
ALBUMIN: 3.7 g/dL (ref 3.4–5.0)
ANION GAP: 8 (ref 7–16)
Alkaline Phosphatase: 30 U/L — ABNORMAL LOW
BUN: 24 mg/dL — AB (ref 7–18)
Bilirubin,Total: 0.3 mg/dL (ref 0.2–1.0)
CREATININE: 1.3 mg/dL (ref 0.60–1.30)
Calcium, Total: 9 mg/dL (ref 8.5–10.1)
Chloride: 110 mmol/L — ABNORMAL HIGH (ref 98–107)
Co2: 24 mmol/L (ref 21–32)
EGFR (Non-African Amer.): 37 — ABNORMAL LOW
GFR CALC AF AMER: 43 — AB
Glucose: 90 mg/dL (ref 65–99)
OSMOLALITY: 287 (ref 275–301)
POTASSIUM: 4.1 mmol/L (ref 3.5–5.1)
SGOT(AST): 22 U/L (ref 15–37)
SGPT (ALT): 19 U/L (ref 12–78)
SODIUM: 142 mmol/L (ref 136–145)
Total Protein: 7.4 g/dL (ref 6.4–8.2)

## 2014-01-29 LAB — LIPID PANEL
Cholesterol: 131 mg/dL (ref 0–200)
HDL: 43 mg/dL (ref 40–60)
Ldl Cholesterol, Calc: 40 mg/dL (ref 0–100)
TRIGLYCERIDES: 242 mg/dL — AB (ref 0–200)
VLDL Cholesterol, Calc: 48 mg/dL — ABNORMAL HIGH (ref 5–40)

## 2014-01-29 LAB — TSH: THYROID STIMULATING HORM: 7.04 u[IU]/mL — AB

## 2014-01-29 LAB — MAGNESIUM: Magnesium: 1.9 mg/dL

## 2014-02-12 LAB — TSH: Thyroid Stimulating Horm: 3.94 u[IU]/mL

## 2014-02-14 ENCOUNTER — Encounter: Payer: Self-pay | Admitting: Internal Medicine

## 2014-02-18 LAB — URINALYSIS, COMPLETE
Bilirubin,UR: NEGATIVE
Blood: NEGATIVE
GLUCOSE, UR: NEGATIVE mg/dL (ref 0–75)
KETONE: NEGATIVE
Nitrite: POSITIVE
PROTEIN: NEGATIVE
Ph: 5 (ref 4.5–8.0)
RBC,UR: 10 /HPF (ref 0–5)
SPECIFIC GRAVITY: 1.016 (ref 1.003–1.030)
Squamous Epithelial: 5

## 2014-02-21 LAB — URINE CULTURE

## 2014-03-17 ENCOUNTER — Encounter: Payer: Self-pay | Admitting: Internal Medicine

## 2014-04-01 ENCOUNTER — Ambulatory Visit: Payer: Self-pay | Admitting: Gerontology

## 2014-04-16 ENCOUNTER — Encounter: Payer: Self-pay | Admitting: Internal Medicine

## 2014-05-17 ENCOUNTER — Encounter: Payer: Self-pay | Admitting: Internal Medicine

## 2014-06-16 ENCOUNTER — Encounter: Payer: Self-pay | Admitting: Internal Medicine

## 2014-07-17 ENCOUNTER — Ambulatory Visit
Admit: 2014-07-17 | Disposition: A | Discharge status: Home / Self Care | Payer: Self-pay | Attending: Nurse Practitioner | Admitting: Nurse Practitioner

## 2014-07-17 ENCOUNTER — Encounter: Payer: Self-pay | Admitting: Internal Medicine

## 2014-07-21 LAB — URINALYSIS, COMPLETE
BILIRUBIN, UR: NEGATIVE
Blood: NEGATIVE
GLUCOSE, UR: NEGATIVE mg/dL (ref 0–75)
Ketone: NEGATIVE
LEUKOCYTE ESTERASE: NEGATIVE
Nitrite: NEGATIVE
Ph: 5 (ref 4.5–8.0)
Protein: NEGATIVE
Specific Gravity: 1.02 (ref 1.003–1.030)
WBC UR: 1 /HPF (ref 0–5)

## 2014-07-22 LAB — URINE CULTURE

## 2014-07-30 LAB — LIPID PANEL
Cholesterol: 108 mg/dL (ref 0–200)
HDL Cholesterol: 38 mg/dL — ABNORMAL LOW (ref 40–60)
LDL CHOLESTEROL, CALC: 42 mg/dL (ref 0–100)
Triglycerides: 142 mg/dL (ref 0–200)
VLDL CHOLESTEROL, CALC: 28 mg/dL (ref 5–40)

## 2014-07-30 LAB — BASIC METABOLIC PANEL
ANION GAP: 6 — AB (ref 7–16)
BUN: 27 mg/dL — ABNORMAL HIGH (ref 7–18)
CO2: 27 mmol/L (ref 21–32)
Calcium, Total: 8.4 mg/dL — ABNORMAL LOW (ref 8.5–10.1)
Chloride: 111 mmol/L — ABNORMAL HIGH (ref 98–107)
Creatinine: 1.08 mg/dL (ref 0.60–1.30)
EGFR (Non-African Amer.): 51 — ABNORMAL LOW
Glucose: 77 mg/dL (ref 65–99)
Osmolality: 291 (ref 275–301)
Potassium: 4.4 mmol/L (ref 3.5–5.1)
SODIUM: 144 mmol/L (ref 136–145)

## 2014-07-30 LAB — CBC WITH DIFFERENTIAL/PLATELET
Basophil #: 0.1 10*3/uL (ref 0.0–0.1)
Basophil %: 0.5 %
EOS ABS: 0.4 10*3/uL (ref 0.0–0.7)
Eosinophil %: 3.2 %
HCT: 27.7 % — ABNORMAL LOW (ref 35.0–47.0)
HGB: 8.6 g/dL — ABNORMAL LOW (ref 12.0–16.0)
LYMPHS PCT: 21.4 %
Lymphocyte #: 2.8 10*3/uL (ref 1.0–3.6)
MCH: 30.2 pg (ref 26.0–34.0)
MCHC: 31 g/dL — ABNORMAL LOW (ref 32.0–36.0)
MCV: 98 fL (ref 80–100)
MONO ABS: 1.1 x10 3/mm — AB (ref 0.2–0.9)
Monocyte %: 8.1 %
NEUTROS PCT: 66.8 %
Neutrophil #: 8.9 10*3/uL — ABNORMAL HIGH (ref 1.4–6.5)
Platelet: 271 10*3/uL (ref 150–440)
RBC: 2.84 10*6/uL — ABNORMAL LOW (ref 3.80–5.20)
RDW: 17.4 % — ABNORMAL HIGH (ref 11.5–14.5)
WBC: 13.3 10*3/uL — ABNORMAL HIGH (ref 3.6–11.0)

## 2014-08-17 ENCOUNTER — Encounter: Payer: Self-pay | Admitting: Internal Medicine

## 2014-09-15 ENCOUNTER — Encounter
Admit: 2014-09-15 | Disposition: A | Discharge status: Home / Self Care | Payer: Self-pay | Attending: Internal Medicine | Admitting: Internal Medicine

## 2014-10-15 LAB — URINALYSIS, COMPLETE
BLOOD: NEGATIVE
Bilirubin,UR: NEGATIVE
GLUCOSE, UR: NEGATIVE mg/dL (ref 0–75)
Ketone: NEGATIVE
Leukocyte Esterase: NEGATIVE
NITRITE: NEGATIVE
PH: 5 (ref 4.5–8.0)
Protein: NEGATIVE
RBC,UR: 2 /HPF (ref 0–5)
Specific Gravity: 1.018 (ref 1.003–1.030)
Squamous Epithelial: 2
WBC UR: NONE SEEN /HPF (ref 0–5)

## 2014-10-16 ENCOUNTER — Encounter
Admit: 2014-10-16 | Disposition: A | Discharge status: Home / Self Care | Payer: Self-pay | Attending: Internal Medicine | Admitting: Internal Medicine

## 2014-10-16 LAB — URINE CULTURE

## 2014-11-03 NOTE — Consult Note (Signed)
Referring Physician:  Ellin Saba   Primary Care Physician:  Ellin Saba : [facility], [address], [place], [place] [address]  Chauncey Cruel, [address] [place], [place] [address], [place] [phone]  Reason for Consult:  Admit Date: 01-Jun-2012   Reason for Consult: seizure   History of Present Illness:  History of Present Illness:   CHIEF COMPLAINT: Referred to the hospital for uncontrolled hypertension. However, the patient developed grand mal seizure activity and became unresponsive.  OF PRESENT ILLNESS: woman who presents to [facility] because of witnessed GTC seizure.  Neurology is asked to evaluate.   is gathered mainly from the son and prior records since the patient has AMS and is intubated. admission, she was seen to have elevated BP.  There was some concern that patient was gazing to the left and may have continued to be making some gagging sounds and possibly still seizing.  She then became unresponsive and was intubated.  Initial labs showed acute respiratory failure.  The family reports that the patient had been having loose bowel movemetns and emesis over the past month which was thought to have been a medication side effect.  Seizure symptoms came on abruptly.  Nothing made the seizure better.  Nothing caused it to come on.  It seemed to stop on its own after a few minutes although as above there was some concern for ongoing seizures as well without major motor component.    could not be performed due to AMS and intbuated status. MEDICAL HISTORY:   1. She had strokes times two in the past. The last one was in November 2012.  2. Chronic obstructive pulmonary disease.  Hyperlipidemia.  Gastroesophageal reflux disease.  Osteoporosis.  Aortic stenosis.   Previously 40 pack year smoker.  no drugs or alcohol.  Quit smoking a number of years ago.   HISTORY: Father with CHF and DM.  Mother with MI.   MEDICATIONS:   1. Tylenol p.r.n.  2. Theophylline 300 mg once a day and theophylline 200 mg once every other day at bedtime. Spiriva 1 inhalation a day.  Simvastatin 40 mg at bedtime.  Senna once a day.  Ranitidine 150 mg twice a day.  Nucynta 50 mg every six hours for pain p.r.n.  Milk of Magnesia p.r.n.  Meclizine  25 mg q. 6 hours p.r.n. for dizziness.  Flexeril 7.5 mg q. 8 hours p.r.n. Flector  patch topical, one patch twice a day.  Evista  60 mg once a day.  Plavix 75 mg a day.  Albuterol inhalation 2 puffs q. 6 hours p.r.n.   ALLERGIES: Sulfa causes itching.  EXAMINATION:  GENERAL:Normocephalic and atraumatic. exam without pharmacologic dilation shows normal discs although visualizatioj is limited due to small pupils. and S2 sounds are within normal limits, without murmurs, gallops, or rubs. - Normal- Decreasedare occasional spontaneous movements in the lower extremities. STATUS:to assess secondary to intubated and AMS. NERVES:eyes are absent.  Pupils are pinpoint.  No blink to threat.  Gag is intact.   response to noxious stim. throughout. to assess secondary to intubated and AMS.  ASSESSMENT/RECS:woman who presents to [facility] because of witnessed GTC seizure.  Neurology is asked to evaluate.  patient does not have history of seizures.  She has been found to have a UTI.  Given her prior stroke about a year ago, these issues could have resulted in lowered seizure threshold.  She is moving both LEs equally although  she moves very little overall so I cannot rule out a focal deficit.  Her HCT was negative for bleed.  Would recommend repeating HCT 72 hours after admission to evaluate for possible subacute ischemic infarction.  Agree with continuing Keppra bid.  There is no evidence to suggest which initial maintenance dose of Keppra is most effective although typically I would recommend using at least 750 mg bid adjusted for renal function if necessary.  Patient should utimately be discharged on the same  maintenance dose.  I would like to have a routine EEG either on Monday or shortly after discharge to evaluate for ictal vs interictal activity.  Agree with continuing to limit her sedative medication and treating her infection.  I have spoken with the son at length who feels that her GI symptoms began around the time that her meloxicam was started and he is wondering if she may do better with celebrex.  I have suggested that he speak with her PCP about this concern.   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  Hypercholesterolemia:   cva:   GERD - Esophageal Reflux:   COPD:   Hysterectomy - Total:   Home Medications: Medication Instructions Last Modified Date/Time  theophylline 300 mg oral capsule, extended release take 1 capsule by mouth every dy  16-Nov-13 03:55  theophylline 200 mg oral tablet, extended release take 1 tablet by mouth every day at bedtime 16-Nov-13 03:55  Spiriva 18 mcg inhalation capsule inhale 1 capsule by mouth every day  16-Nov-13 03:55  sertraline 25 mg oral tablet 1 tab(s) orally once a day 16-Nov-13 03:55  ranitidine 150 mg oral tablet 1 tab(s) orally 2 times a day 16-Nov-13 03:55  meloxicam 15 mg oral tablet 1 tab(s) orally once a day 16-Nov-13 03:55  Exelon 13.3 mg/24 hr transdermal film, extended release 1 patch transdermal once a day 16-Nov-13 03:55  donepezil 5 mg oral tablet 1 tab(s) orally once a day (at bedtime) 16-Nov-13 03:55  clopidogrel 75 mg oral tablet 1 tab(s) orally once a day 16-Nov-13 03:55   Allergies:  Sulfa drugs: Itching  Vital Signs: **Vital Signs.:   16-Nov-13 12:19   Pulse Pulse 56   Respirations Respirations 14   Systolic BP Systolic BP 662   Diastolic BP (mmHg) Diastolic BP (mmHg) 32   Mean BP 54   Pulse Ox % Pulse Ox % 98   Oxygen Delivery Ventilator Assisted   Pulse Ox Heart Rate 54   Lab Results:  Hepatic:  16-Nov-13 01:03    Bilirubin,  Total 0.5   Alkaline Phosphatase 120   SGPT (ALT) 17   SGOT (AST) 26   Total Protein, Serum  8.7   Albumin, Serum 4.6  Routine BB:  16-Nov-13 01:03    ABO Group + Rh Type A Positive   Antibody Screen NEGATIVE (Result(s) reported on 01 Jun 2012 at 02:25AM.)  Routine Chem:  16-Nov-13 00:55    Result Comment - NOTIFIED OF CRITICAL VALUE  - HAND DELIVERED  - Dr. Ulice Brilliant at [facility] on 06/01/12  Result(s) reported on 01 Jun 2012 at 01:03AM.    01:03    Glucose, Serum  182   BUN  21   Creatinine (comp) 0.86   Sodium, Serum 141   Potassium, Serum  3.1   Chloride, Serum 105   CO2, Serum 21   Calcium (Total), Serum 9.2  Osmolality (calc) 289   eGFR (African American) >60   eGFR (Non-African American) >60 (eGFR values <7m/min/1.73 m2 may be an indication of chronic kidney disease (CKD). Calculated eGFR is useful in patients with stable renal function. The eGFR calculation will not be reliable in acutely ill patients when serum creatinine is changing rapidly. It is not useful in  patients on dialysis. The eGFR calculation may not be applicable to patients at the low and high extremes of body sizes, pregnant women, and vegetarians.)   Anion Gap 15  Cardiac:  16-Nov-13 01:03    CK, Total 103   CPK-MB, Serum 2.0 (Result(s) reported on 01 Jun 2012 at 01:52AM.)   Troponin I < 0.02 (0.00-0.05 0.05 ng/mL or less: NEGATIVE  Repeat testing in 3-6 hrs  if clinically indicated. >0.05 ng/mL: POTENTIAL  MYOCARDIAL INJURY. Repeat  testing in 3-6 hrs if  clinically indicated. NOTE: An increase or decrease  of 30% or more on serial  testing suggests a  clinically important change)  Routine UA:  16-Nov-13 01:03    Color (UA) Yellow   Clarity (UA) Cloudy   Glucose (UA) Negative   Bilirubin (UA) Negative   Ketones (UA) 1+   Specific Gravity (UA) 1.017   Blood (UA) 1+   pH (UA) 6.0   Protein (UA) 100 mg/dL   Nitrite (UA) Negative   Leukocyte Esterase (UA) Trace (Result(s) reported on  01 Jun 2012 at 01:46AM.)   RBC (UA) 3 /HPF   WBC (UA) 5 /HPF   Bacteria (UA) 1+   Epithelial Cells (UA) <1 /HPF   Mucous (UA) PRESENT   Hyaline Cast (UA) 1 /LPF (Result(s) reported on 01 Jun 2012 at 01:46AM.)  Routine Coag:  16-Nov-13 01:03    Prothrombin 13.4   INR 1.0 (INR reference interval applies to patients on anticoagulant therapy. A single INR therapeutic range for coumarins is not optimal for all indications; however, the suggested range for most indications is 2.0 - 3.0. Exceptions to the INR Reference Range may include: Prosthetic heart valves, acute myocardial infarction, prevention of myocardial infarction, and combinations of aspirin and anticoagulant. The need for a higher or lower target INR must be assessed individually. Reference: The Pharmacology and Management of the Vitamin K  antagonists: the seventh ACCP Conference on Antithrombotic and Thrombolytic Therapy. CQPYPP.5093Sept:126 (3suppl): [id number] A HCT value >55% may artifactually increase the PT.  In one study,  the increase was an average of 25%. Reference:  "Effect on Routine and Special Coagulation Testing Values of Citrate Anticoagulant Adjustment in Patients with High HCT Values." American Journal of Clinical Pathology 2006;126:400-405.)   Activated PTT (APTT) 35.2 (A HCT value >55% may artifactually increase the APTT. In one study, the increase was an average of 19%. Reference: "Effect on Routine and Special Coagulation Testing Values of Citrate Anticoagulant Adjustment in Patients with High HCT Values." American Journal of Clinical Pathology 2006;126:400-405.)  Routine Hem:  16-Nov-13 01:03    WBC (CBC)  24.2   RBC (CBC) 4.65   Hemoglobin (CBC) 14.0   Hematocrit (CBC) 42.9   Platelet Count (CBC) 306   MCV 92   MCH 30.2   MCHC 32.7   RDW  17.0   Neutrophil % 72.8   Lymphocyte % 20.9   Monocyte % 5.6   Eosinophil % 0.2   Basophil % 0.5   Neutrophil #  17.6   Lymphocyte #  5.0   Monocyte  #  1.3   Eosinophil # 0.1   Basophil #  0.1 (Result(s) reported on 01 Jun 2012 at 01:38AM.)   Radiology Results: CT:    16-Nov-13 01:55, CT Head Without Contrast   CT Head Without Contrast    REASON FOR EXAM:    seizure  COMMENTS:   LMP: Post Hysterectomy    PROCEDURE: CT  - CT HEAD WITHOUT CONTRAST  - Jun 01 2012  1:55AM     RESULT: History: Seizure.    Comparison Study: No recent.    Findings: Standard nonenhanced CT obtained. No mass. No hydrocephalus. No   hemorrhage. Periventricular changes noted consistent with chronic   ischemia.    IMPRESSION:  No acute abnormality. White matter changes consistent   chronic ischemia. No change noted from prior study.    Verified By: Osa Craver, M.D., MD   Electronic Signatures: Anabel Bene (MD)  (Signed [phone] 14:20)  Authored: REFERRING PHYSICIAN, Primary Care Physician, Consult, History of Present Illness, PAST MEDICAL/SURGICAL HISTORY, HOME MEDICATIONS, ALLERGIES, NURSING VITAL SIGNS, LAB RESULTS, RADIOLOGY RESULTS   Last Updated: 16-Nov-13 14:20 by Anabel Bene (MD)

## 2014-11-03 NOTE — Consult Note (Signed)
PATIENT NAME:  Peggy ConteHUDSON, Peggy E MR#:  [id number] DATE OF BIRTH:  May 28, 1925  DATE OF CONSULTATION:  06/02/2012  REFERRING PHYSICIAN:   CONSULTING PHYSICIAN:  Marcina MillardAlexander Melana Hingle, MD  PRIMARY CARE PHYSICIAN:  Dr. Dossie Arbourrissman  CHIEF COMPLAINT:  Seizure.  REASON FOR CONSULTATION: Consultation requested for evaluation of elevated troponin.   HISTORY OF PRESENT ILLNESS: The patient is an 81 year old female who was admitted with uncontrolled hypertension and witnessed grand mal seizure. The patient apparently was brought to [facility] Emergency Room from assisted living because of markedly elevated blood pressure. Upon arrival the patient experienced a grand mal seizure requiring intubation and mechanical ventilator and now is admitted to the Critical Care Unit. The patient has a borderline elevated troponin of 0.34 without peak or trough, prior history of chest pain or ischemic ECG changes.   PAST MEDICAL HISTORY:  1. Status post cerebrovascular accident times two. 2. Aortic stenosis.  3. Chronic obstructive pulmonary disease.  4. Hyperlipidemia.  5. Gastroesophageal reflux disease.   MEDICATIONS:  1. Simvastatin 40 mg daily.  2. Clopidogrel 75 mg daily.  3. Tylenol p.r.n.  4. Theophylline 300 mg alternating with 200 mg every other day.  5. Spiriva one inhalation capsule daily.  6. Senna 1 daily.  7. Ranitidine 150 mg b.i.d.  8. Nucynta 50 mg q. 6 p.r.n.  9. Meclizine 25 mg every six hours p.r.n.  10. Flexeril 7.5 mg q. 8 p.r.n.  11. Flector  patch, one patch b.i.d.  12. Evista 60 mg daily.  13. Albuterol inhaler 2 puffs q. 6 hours p.r.n.   SOCIAL HISTORY: The patient currently resides at an assisted living facility. She has a 40 pack-year tobacco abuse history.   FAMILY HISTORY: Father and mother both with history of coronary artery disease, myocardial infarction, and congestive heart failure.   REVIEW OF SYSTEMS: Not obtainable since the patient is on a  mechanical ventilator.   PHYSICAL EXAMINATION:  VITAL SIGNS: Blood pressure 118/48, pulse 134, respirations 13, pulse oximetry 97%.   HEENT: Pupils equal, reactive to light and accommodation.   NECK: Supple without thyromegaly.   LUNGS: Clear.   HEART: Normal jugular venous pressure. Tachycardia. Normal S1, S2. No appreciable gallop, murmur, or rub.   ABDOMEN: Soft and nontender.   EXTREMITIES: Pulses were intact bilaterally.   MUSCULOSKELETAL: Normal muscle tone.   NEUROLOGIC: The patient is alert and oriented times three. Motor and sensory both grossly intact.   IMPRESSION: This is an 81 year old female who presents with uncontrolled hypertension and seizure with borderline elevated troponin. Elevated troponin likely due to demand/ supply ischemia and not due to acute coronary syndrome.   RECOMMENDATIONS:  1. Agree with overall current therapy.  2. Defer full dose anticoagulation.  3. Review 2-D echocardiogram.  4. No further cardiac diagnostics required at this time.     ____________________________ Marcina MillardAlexander Jordis Repetto, MD ap:bjt D: 06/02/2012 11:04:46 ET T: 06/02/2012 13:45:14 ET JOB#: [id number]  cc: Marcina MillardAlexander Destani Wamser, MD, <Dictator> Marcina MillardALEXANDER Gaetana Kawahara MD ELECTRONICALLY SIGNED 06/28/2012 14:28

## 2014-11-03 NOTE — Consult Note (Signed)
Comments   Josh Borders, NP, and I met with pt's son, Sammy/HCPOA, and wife of pt's other son. Updated them on pt's current condition. Son brought pt's living will to hospital which says that she does not want care prolonged if it is deemed to be futile. Son is hopeful that pt can successfully be weaned from vent. However, in the event she is not, he will not proceed with trach. We also discussed code status and family does not want CPR/defibrillation in the event of cardiac arrest. Order entered.   Electronic Signatures: Aminat Shelburne, Izora Gala (MD)  (Signed [phone] 10:24)  Authored: Palliative Care   Last Updated: 20-Nov-13 10:24 by Gaven Eugene, Izora Gala (MD)

## 2014-11-03 NOTE — Consult Note (Signed)
General Aspect 81 y/o WF came in  from assisted living, h/o of large CVA in the past left with left sided weakness, falls,  presenting from PMD with uncontrolled HTN, vomiting and diarrhea, on arrival to [facility] had  witnessed grand mal seizure episode, became unconscious, was intuibated for airway protection. Attempted wean of the vent yesterday, was on proprofol, changed to precedex, then developing severe hypotension/bradycardia, started on dopamine for hemodyanmic support. Cardiology was consulted for arrhythmia, elevated cardiac enz.Noted to be in atrial fib on arrival, rate 138 bpm.  It appears that CCU desk contacted both [facility] cardiology and [facility] cardiology for the same consult.    Present Illness . SOCIAL HABITS:  Per her medical records she was a heavy smoker but she is now an ex- smoker. She has history of 40 pack-years of smoking. No history of alcohol or other drug abuse.   SOCIAL HISTORY: She is widowed, lives at the [place] assisted facility.   FAMILY HISTORY: Her father died from congestive heart failure and he suffered from diabetes. Her mother had a heart attack, unclear at what age.   Physical Exam:   GEN well developed, well nourished, no acute distress    HEENT red conjunctivae    NECK supple    RESP clear BS  intubated    CARD Regular rate and rhythm  No murmur    ABD denies tenderness  soft    LYMPH negative neck    EXTR negative edema    SKIN normal to palpation    NEURO motor/sensory function intact    PSYCH sedated   Review of Systems:   ROS Pt not able to provide ROS    Medications/Allergies Reviewed Medications/Allergies reviewed     chronic back pain: disgenterative dics disease and back spasms.   Hypercholesterolemia:    cva:    GERD - Esophageal Reflux:    COPD:    Hysterectomy - Total:   Home Medications: Medication Instructions Status  theophylline 300 mg oral capsule, extended release take 1 capsule by mouth every  dy  Active  theophylline 200 mg oral tablet, extended release take 1 tablet by mouth every day at bedtime Active  Spiriva 18 mcg inhalation capsule inhale 1 capsule by mouth every day  Active  sertraline 25 mg oral tablet 1 tab(s) orally once a day Active  ranitidine 150 mg oral tablet 1 tab(s) orally 2 times a day Active  meloxicam 15 mg oral tablet 1 tab(s) orally once a day Active  Exelon 13.3 mg/24 hr transdermal film, extended release 1 patch transdermal once a day Active  donepezil 5 mg oral tablet 1 tab(s) orally once a day (at bedtime) Active  clopidogrel 75 mg oral tablet 1 tab(s) orally once a day Active   Lab Results:  Routine Chem:  17-Nov-13 05:20    Result Comment TROPONIN - RESULTS VERIFIED BY REPEAT TESTING.  - HIGH PREVIOUSLY CALLED BY CAF AT 1814  - ON 06-01-12/[facility]  Result(s) reported on 02 Jun 2012 at 07:04AM.   Glucose, Serum  137   BUN  25   Creatinine (comp)  1.76   Sodium, Serum 138   Potassium, Serum  3.3   Chloride, Serum 107   CO2, Serum  20   Calcium (Total), Serum 8.5   Anion Gap 11   Osmolality (calc) 282   eGFR (African American)  30   eGFR (Non-African American)  26 (eGFR values <63m/min/1.73 m2 may be an indication of chronic kidney  disease (CKD). Calculated eGFR is useful in patients with stable renal function. The eGFR calculation will not be reliable in acutely ill patients when serum creatinine is changing rapidly. It is not useful in  patients on dialysis. The eGFR calculation may not be applicable to patients at the low and high extremes of body sizes, pregnant women, and vegetarians.)  Cardiac:  17-Nov-13 05:20    Troponin I  0.34 (0.00-0.05 0.05 ng/mL or less: NEGATIVE  Repeat testing in 3-6 hrs  if clinically indicated. >0.05 ng/mL: POTENTIAL  MYOCARDIAL INJURY. Repeat  testing in 3-6 hrs if  clinically indicated. NOTE: An increase or decrease  of 30% or more on serial  testing suggests a  clinically important change)   Routine Hem:  17-Nov-13 05:20    WBC (CBC)  19.6   RBC (CBC)  3.49   Hemoglobin (CBC)  10.4   Hematocrit (CBC)  31.7   Platelet Count (CBC) 226   MCV 91   MCH 29.8   MCHC 32.8   RDW  16.8   Neutrophil % 90.2   Lymphocyte % 2.8   Monocyte % 6.6   Eosinophil % 0.1   Basophil % 0.3   Neutrophil #  17.7   Lymphocyte #  0.5   Monocyte #  1.3   Eosinophil # 0.0   Basophil # 0.1    Sulfa drugs: Itching  Vital Signs/Nurse's Notes: **Vital Signs.:   17-Nov-13 12:00   Temperature Temperature (F) 98.4   Celsius 36.8   Temperature Source oral   Pulse Pulse 104   Respirations Respirations 16   Systolic BP Systolic BP 113   Diastolic BP (mmHg) Diastolic BP (mmHg) 44   Mean BP 67   Pulse Ox % Pulse Ox % 99   Oxygen Delivery Ventilator Assisted   Pulse Ox Heart Rate 104     Impression It appears that CCU desk contacted both [facility] cardiology and [place] cardiology for the same consult. A/P below Will sign off and let [facility]ology follow.  Plan below is consistent with plan from [facility] cardiology  81 y/o WF came in  from assisted living, h/o of large CVA in the past left with left sided weakness, falls,  presenting from PMD with uncontrolled HTN, vomiting and diarrhea, on arrival to [facility] had  witnessed grand mal seizure episode, became unconscious, was intuibated. severe hypotension/bradycardia yesterday, started on dopamine for hemodyanmic support. mild elevation of cardiac enz.  1)grand mal seizure keppra, eeg followed by neurology  2- Acute respiratory failure  from the seizure episode  h/o COPD followed by pulmonary attempting to wean off vent  3- Uncontrolled HTN  BP low, stable, on dopamine Will monitor BP as sedation is weaned  4- Atrial fib with RVR, new in the setting of seizure/resp distress now sinus rhythm, some sinus tach  5- Elevated cardiac enz: from low BP, bradycardia, unable to exclude bump in enz from atrial fib May have underlying CAD.ENZ elevation  is very mild. Would hold off on cardiac workup at this time. This could be done after extubated, and stable, possibly as an outpt  6- Vomiting and diarrhea on and off for several weeks per family -empiriaclly on IV cipro +flagyl.  -stool studies pending   Electronic Signatures: Gollan, Timothy (MD)  (Signed 17-Nov-13 14:16)  Authored: General Aspect/Present Illness, History and Physical Exam, Review of System, Past Medical History, Home Medications, Labs, Allergies, Vital Signs/Nurse's Notes, Impression/Plan   Last Updated: 17-Nov-13 14:16 by Gollan, Timothy (MD) 

## 2014-11-03 NOTE — Consult Note (Signed)
    Comments   After speaking with Dr Meredeth IdeFleming, sons are in agreement with extubation now. Therefore, pt extubated. Appears comfortable though not communicating. Family at bedside. Clinical status tenuous.   Electronic Signatures: Shaunita Seney, Harriett SineNancy (MD)  (Signed [phone]-Nov-13 17:52)  Authored: Palliative Care   Last Updated: 20-Nov-13 17:52 by Yadier Bramhall, Harriett SineNancy (MD)

## 2014-11-03 NOTE — Discharge Summary (Signed)
PATIENT NAME:  Peggy Stone, Peggy Stone MR#:  [id number] DATE OF BIRTH:  11-Apr-1925  DATE OF ADMISSION:  06/01/2012 DATE OF DISCHARGE:  06/14/2012  DISPOSITION: Skilled nursing facility.   ADMITTING DIAGNOSIS: Uncontrolled hypertension followed by grand mal seizure activity and altered mental status.   FINAL DIAGNOSES:  1. New onset grand mal seizure.  2. Acute left occipital cerebrovascular accident.  3. Acute respiratory failure due to seizure.  4. Uncontrolled hypertension on admission.  5. Atrial fibrillation with rapid ventricular response.  6. Chronic obstructive pulmonary disease.  7. Vomiting with diarrhea.  8. Gastroesophageal reflux.  9. Osteoporosis.  10. Aortic stenosis.  11. Hypokalemia.  12. Leukocytosis.  13. Escherichia coli urinary tract infection.  14. Anemia.  15. Acute renal failure.  16. Aspiration pneumonia.   CONSULTANTS:   1. Dr. Harriett Sine Phifer, palliative care. 2. Dr. Julien Nordmann and Dr. Jamse Mead, cardiology.  3. Dr. Theora Master, neurology.  4. Dr. Ned Clines, pulmonary.   IMPORTANT RESULTS: CT scan of the head showed on 06/03/2012 evolving small left occipital infarct. No associated hemorrhage.  Sinus disease. Positive old right cerebellar infarct. Chest x-ray showed multiple atelectases and on 11/27 left hemidiaphragm remains partially obscured.  Perihilar markings are mildly increased. That was changed from previous, which likely shows aspiration. The one on 11/26: Trace left pleural effusion, early left lung base aspiration likely. Echocardiogram shows normal left ventricular function with left ventricular hypertrophy, mild to moderate aortic stenosis. Blood cultures have been negative. Urine culture did show Escherichia coli. Sputum culture with moderate white blood cells. White count on admission 24.2. Cardiac enzymes 0.08 to 0.034.   HOSPITAL COURSE: Peggy Stone is a very nice 81 year old female with past medical history of osteoporosis, aortic  stenosis, cerebrovascular accidents times 2 in the past, chronic obstructive pulmonary disease, hypertension, and hyperlipidemia, who is a resident of an assisted living facility and brought to the ER due to increase in her blood pressure, which at that time was uncontrolled. She also developed some nausea, vomiting, and diarrhea and in the ER developed acute of  a grand mal seizure for which the patient was intubated and transferred to the Intensive Care Unit. The patient remained intubated for several days until she was able to be extubated. Her mental status came back with good activity. During the time that she was intubated there were concerns about neurologic damage after the seizure because the patient was not doing much. She also developed severe shock which was likely due to intravascular volume depletion and possible sepsis for which she needed to be on pressors. She was put on Levophed but her heart rate was very low for which she was changed at some point to dopamine and her heart rate improved.   PROBLEM LIST: As far as problems in general: 1. Acute respiratory failure: The patient was intubated mostly for airway protection at admission, and she was extubated on 06/05/2012.  A consultation with palliative care was obtained and the family decided to go to DO NOT RESUSCITATE in case the patient needed to be reintubated, but she was successfully extubated on 06/05/2012. She continued to have some respiratory problems due to chronic obstructive pulmonary disease for which she remained on any inhalers and nebulizers. She also was put on steroids. She did very well up until 06/11/2012 when she again developed severe acute respiratory distress. The patient actually had a chest x-ray earlier  in the morning that showed the beginning of possible aspiration and at that time she  was having significant wheezing. She was given steroids and put on Zosyn. Respiratory treatments were given more frequently and the  patient started getting better within a couple of days. Speech therapy had evaluated the patient and started on a diet and we felt like that she could continue a diet with a lot of precautions for aspiration like sitting up while eating, small bites, no straws, mechanical soft diet, puree, and nectar thick liquids. The patient is actually doing much better and today we are changing her antibiotics for oral antibiotics to complete the treatment for 10 days.  2. New onset grand mal seizure: The patient had cerebrovascular accidents in the past x2, and then on this new hospitalization did show acute new left occipital cerebrovascular accident. Neurology was consulted. Dr. Malvin Johns saw the patient and did an EEG that showed bihemispheric dysfunction for which the patient was started on Keppra.  It was given IV at the beginning when the patient was in the Intensive Care Unit, but now she is taking it orally at 500 mg every 12 hours. The patient has not had any other indication of seizures. We recommended that the patient follow up with Dr. Malvin Johns for medication management.  3. Cerebrovascular accident, acute left occipital cerebrovascular accident: The patient has had  previous CVAs for which the patient was taking aspirin. After that she was put on Plavix and statin was continued and physical therapy was recommended. The patient does not have any major deficit from this stroke other than mostly generalized weakness. Echocardiogram showed normal ejection fraction with mild to moderate aortic stenosis. No blood clots.  4. Uncontrolled hypertension:  The patient had malignant hypertension with systolic blood pressures above 200. Subsequently she became hypotensive with sedation for which she needed to be on dopamine for pressors. She was weaned off and after a while her blood pressure started to come up.  It was safe to start doing therapy for which enalapril and Lopressor were started. At this moment the patient is going  to be going to the skilled nursing facility with blood pressure medications. She can follow up with cardiology if necessary.  5. Atrial fibrillation with rapid ventricular response: This was one single episode, likely due to all the stress with respiratory failure and seizure. The patient converted right back to normal sinus rhythm. She was evaluated by Dr. Mariah Milling and Dr. Darrold Junker and they recommended no different treatments.  6. Chronic obstructive pulmonary disease: The patient had a mild flare initially for which she was given steroids. At this moment she is off the steroids. Continue nebulizers due to aspiration pneumonia.  7. Nausea and vomiting for several weeks: The patient was treated for possible gastroenteritis or colitis with Cipro and Flagyl but all the stool did not show any severe inflammation.  8. Escherichia coli urinary tract infection: The patient was treated with Rocephin.  9. Aspiration pneumonia: As mentioned above, the patient has been Zosyn and now and the medications have been changed to Augmentin. She is getting much better. Continue precautions for aspiration.  10. Anemia due to dilution and due to acute disease and also chronic disease: The patient is to be sent to a skilled nursing facility on iron.  11. Acute renal failure due to shock and hypotension: Right now the patient is back to baseline.  DISCHARGE MEDICATIONS: 1. Spiriva 18 mcg daily.  2. Sertraline 25 mg daily.  3. Ranitidine 150 mg 2 times daily.  4. Donepezil 5 mg once a day. 5. Plavix 75  mg once a day. 6. Tylenol suppository p.r.n.  7. Lisinopril 20 mg once daily.  8. Clonidine 0.2 mg transdermal once a week.  9. Keppra 500 mg every 12 hours.  10. Lipitor 20 mg once a day.  11. Ecotrin 81 mg once a day.  12. Lorazepam 0.5 mg q. 4 hours.  13. Metoprolol 25 mg 2 times a day.  14. Albuterol and Atrovent nebulizers.  15. Amlodipine 5 mg once a day.  16. Ferrex 150 mg 2 times a day. 17. Senna for  constipation.  18. Advair Diskus 250/50 twice daily.  19. Amoxicillin with clavulanic 875/125 mg twice a day for another six days.  The patient was previously on theophylline which we are stopping, meloxicam which we are stopping, and Exelon which we are stopping.   TIME SPENT: I spent about 70 minutes with this discharge and discussing the case with the family.    ____________________________ Felipa Furnaceoberto Sanchez Gutierrez, MD rsg:bjt D: 06/14/2012 14:58:16 ET T: 06/14/2012 15:20:41 ET JOB#: [id number]  cc: Felipa Furnaceoberto Sanchez Gutierrez, MD, <Dictator> Marcelis Wissner Juanda ChanceSANCHEZ GUTIERRE MD ELECTRONICALLY SIGNED 06/16/2012 22:59

## 2014-11-03 NOTE — H&P (Signed)
PATIENT NAME:  Peggy Stone, Peggy Stone MR#:  [id number] DATE OF BIRTH:  17-May-1925  DATE OF ADMISSION:  06/01/2012  PRIMARY CARE PHYSICIAN: Dr. Vonita Moss   REFERRING PHYSICIAN:  Dr. Clemens Catholic  CHIEF COMPLAINT: Referred to the hospital for uncontrolled hypertension. However, the patient developed grand mal seizure activity and became unresponsive.   HISTORY OF PRESENT ILLNESS: Peggy Stone is an 81 year old Caucasian female who lives in an assisted facility and was transferred from the rest home to the hospital for evaluation of uncontrolled hypertension. Upon arrival here the patient had witnessed grand mal seizure activity. The patient was noticed to be gazing to the left and she was gagging and seizing. She became unresponsive and due to airway compromise the patient was intubated and connected to a ventilator. Her initial blood gas showed evidence of acute respiratory failure. I spoke with her son who arrived here and he reported that for the last one month the patient was having on and off loose bowel movements and sometimes vomiting, but this was not consistent and not every day. It was thought secondary to meloxicam. No other new symptoms reported.   REVIEW OF SYSTEMS: 10-point system review was unobtainable due to the patient's unresponsiveness, being intubated and connected to the ventilator, and sedated.   PAST MEDICAL HISTORY:   1. She had strokes times two in the past. The last one was in November 2012.  2. Chronic obstructive pulmonary disease.  3. Hyperlipidemia.  4. Gastroesophageal reflux disease.  5. Osteoporosis.  6. Aortic stenosis.   SOCIAL HABITS:  Per her medical records she was a heavy smoker but she is now an ex- smoker. She has history of 40 pack-years of smoking. No history of alcohol or other drug abuse.   SOCIAL HISTORY: She is widowed, lives at the [facility] assisted facility.   FAMILY HISTORY: Her father died from congestive heart failure and he suffered from  diabetes. Her mother had a heart attack, unclear at what age.   ADMISSION MEDICATIONS:  1. Tylenol p.r.n.  2. Theophylline 300 mg once a day and theophylline 200 mg once every other day at bedtime. Spiriva 1 inhalation a day.  3. Simvastatin 40 mg at bedtime.  4. Senna once a day.  5. Ranitidine 150 mg twice a day.  6. Nucynta 50 mg every six hours for pain p.r.n.  7. Milk of Magnesia p.r.n.  8. Meclizine  25 mg q. 6 hours p.r.n. for dizziness.  9. Flexeril 7.5 mg q. 8 hours p.r.n. 10. Flector  patch topical, one patch twice a day.  11. Evista  60 mg once a day.  12. Plavix 75 mg a day.  13. Albuterol inhalation 2 puffs q. 6 hours p.r.n.   ALLERGIES: Sulfa causes itching.   PHYSICAL EXAMINATION:  VITAL SIGNS: Blood pressure initially was 200/91. Follow-up was 168/73, respiratory rate 14. Right now she is on ventilator. Pulse earlier was 130 and now is down to 88. Temperature 97.9. Pulse oximetry is 100% while she is on the ventilator and on FiO2 of 100%.   GENERAL APPEARANCE: Elderly lady lying in bed, well sedated, intubated and connected to a ventilator.   HEAD: No pallor. No icterus. No cyanosis.   EARS, NOSE, AND THROAT: No discharge. No bleeding. No lesions. Nasal mucosa, no discharge. No bleeding. No lesions. Oral mucosa, throat, lips, and tongue were normal. No oral thrush. No ulcers. I could not examine the throat due to endotracheal tube.   EYES: Normal eyelids and conjunctivae. Pupils  are round and pinpoint, could not see reactivity to light due to very constricted pupils due to patient now on fentanyl intravenously.   NECK: Supple. Trachea at midline. No thyromegaly. No cervical lymphadenopathy. No masses.   HEART: Normal S1, S2. No S3, S4. No gallop. No carotid bruits. No heart murmur.   RESPIRATORY: Normal breathing pattern while the patient is on the ventilator. She is not using any accessory muscles and she is not breathing over the ventilator. No wheezing. No rales.    ABDOMEN: Soft without tenderness. No rigidity. No hepatosplenomegaly. No masses. No hernias.   SKIN: No ulcers. No subcutaneous nodules.   MUSCULOSKELETAL: No joint swelling. No clubbing.   NEUROLOGIC: The patient is intubated and well sedated. No facial asymmetry.  Eye movements  were normal. The rest of the neurologic examination was limited due to the patient being on IV sedation and on IV propofol. She was noticed to move her upper extremities and her feet, sluggishly, more on the right than the left.   PSYCHIATRIC: Unable to perform due to patient being intubated and sedated.   LABORATORY FINDINGS: CT scan of the head showed no acute intracerebral bleed or any acute findings. Chest x-ray showed no consolidation, no effusion. EKG showed atrial fibrillation with rapid ventricular rate at 138 per minute. Possible old anterior septal myocardial infarction. Diffuse nonspecific ST-T wave abnormalities. Serum glucose 182, BUN 21, creatinine 0.8, sodium 141, potassium 3.1, calcium 9.2. Normal liver function tests. Troponin less than 0.02. CBC showed white count 24,000, hemoglobin 14, hematocrit 42, platelet count 306. Prothrombin time 13. INR 1. APTT 35. Urinalysis showed cloudy urine, 5 white blood cells, 3 RBCs, +1 bacteria. Arterial blood gas showed a pH of 7.02, pCO2 86, pO2 255. That was on FiO2 of 100%. Repeat arterial blood gas on FiO2 of 50% showed a pH of 7.39, pCO2 33, pO2 157.   IMPRESSION:  1. Grand mal seizure activity, first episode. 2. Acute respiratory failure likely secondary to the seizure activity.  3. Uncontrolled hypertension.  4. Atrial fibrillation with rapid ventricular rate.  5. Leukocytosis.  6. Chronic obstructive pulmonary disease.  7. Hypokalemia.  8. Recent complaint of on and off diarrhea and vomiting, etiology is unclear.  9. History of strokes times two.  10. History of gastroesophageal reflux disease.  11. History of mild dementia.  12. Hyperlipidemia.   13. Osteoporosis.  14. History of aortic stenosis.   PLAN: We will admit the patient to the Intensive Care Unit. Continue ventilatory management. Blood cultures were ordered. Also stool for culture and C. difficile. Urine for culture. Empiric IV antibiotic using ciprofloxacin along with Flagyl. Control hypertension with enalapril 2.5 mg IV q. 8 h. P.r.n. doses of hydralazine if her blood pressure is uncontrolled. Gentle IV hydration with potassium replacement to correct hypokalemia. For her active fibrillation, for the time being we will just control the rate.  The latest heart rate is controlled, below 100. In fact it is in the 80s now. Regarding anticoagulation and her age and history of dementia, we will resort to aspirin. I will obtain an echocardiogram.  We will consult neurology to evaluate her seizure episodes. There is a possibility that she had a stroke given the uncontrolled hypertension and also the episode of atrial fibrillation. However, the initial CT scan of the head did not show that and we might need to repeat that in two days or consider MRI if needed. I will hold the rest of the home medications until she is  extubated and then we may resume. I spoke to the patient's son. He reported that she does not want to be intubated if her condition is irreversible and incurable. For the time being she is FULL CODE.  TIME SPENT EVALUATING THIS PATIENT: More than 1 hour and 15 minutes including 20 minutes of intensive care management.    ____________________________ Carney Corners. Rudene Re, MD amd:bjt D: 06/01/2012 04:15:56 ET T: 06/01/2012 09:36:05 ET JOB#: [id number]  cc: Carney Corners. Rudene Re, MD, <Dictator> Steele Sizer, MD Karolee Ohs Dala Dock MD ELECTRONICALLY SIGNED 06/02/2012 3:19

## 2014-11-08 NOTE — Consult Note (Signed)
PATIENT NAME:  Peggy Stone, Peggy Stone MR#:  [id number] DATE OF BIRTH:  Jun 11, 1925  DATE OF CONSULTATION:  07/21/2011  REFERRING PHYSICIAN:  Bayard Males, MD   PRIMARY CARE PHYSICIAN: Einar Crow, MD   REASON FOR CONSULTATION: TIA, right arm numbness.   HISTORY OF PRESENT ILLNESS: This is an 81 year old white female with past medical history of recent stroke who had some slurred speech and right hand numbness which resolved fairly quickly. The patient states that this began yesterday and resolved in several hours. She had no chest pain or shortness of breath. No nausea, vomiting, diarrhea, fevers, chills, or shakes. No visual disturbances or hearing disturbance, or headache. She said she was recently admitted at [facility] and started on Plavix for acute stroke. She did not want to be admitted, spoke to her son on several occasions today. We did MRI in addition to CT scan which showed no acute intracranial abnormality, subsequently, the patient was discharged from the ER with outpatient physical therapy.   PAST MEDICAL HISTORY:  1. CVA, acute, x2, last in 05/2011. 2. Chronic obstructive pulmonary disease, oxygen dependent. 3. Hyperlipidemia.  4. Osteoporosis.  5. Gastroesophageal reflux disease.   MEDICATIONS AT HOME:  1. Simvastatin 40 mg daily.  2. Theophylline 200 mg every day at bedtime.  3. Albuterol inhaler 2 puffs q.4 to 6 hours p.r.n.  4. Spiriva 18 mcg every day. 5. Evista 60 mg every day. 6. Flexeril 7.5 mg every eight hours p.r.n.  7. Tylenol 500 mg every six hours p.r.n. pain.  8. Plavix 75 mg daily.  9. Ranitidine 150 mg 2 times a day. 10. Senna Plus 50/8.6 2 tablets once a day. 11. Meclizine 25 mg every six hours as needed. 12. Nucynta 50 mg q.6 hours p.r.n. as needed for pain. 13. Milk of Magnesia 30 mL once a day as needed.   DRUG ALLERGIES: Sulfa drugs.   FAMILY HISTORY: Her father died of diabetes and heart failure. Her mother had a heart attack.    SOCIAL HISTORY: The patient was a heavy smoker and now does not smoke. She has a 40 pack-year history. No alcohol or IV drug use. She lives in the [place], in assisted living.  REVIEW OF SYSTEMS:  CONSTITUTIONAL:  No fever, fatigue, or weakness. EYES: No blurred vision, double vision, pain, redness, inflammation, or glaucoma. ENT: No tinnitus, ear pain, hearing loss, seasonal allergies, epistaxis, or discharge. RESPIRATORY: No cough, wheezing, hemoptysis, dyspnea, asthma, or painful respirations. CARDIOVASCULAR: No chest pain, orthopnea, edema, arrhythmias, dyspnea on exertion, palpitations, or syncope. GI: No nausea, vomiting, diarrhea, abdominal pain, hematemesis, melena, or gastroesophageal reflux disease. GU: No dysuria, hematuria, renal calculi, increased frequency, or incontinence. GYN: No breast mass, tenderness, or vaginal discharge. ENDOCRINE: No polyuria, nocturia, thyroid problems, increased sweating, heat or cold intolerance. HEME/LYMPH: No anemia, easy bruising, bleeding, or swollen glands. INTEGUMENT: No acne, rash lesions, or change in mole, hair or skin. MUSCULOSKELETAL: No pain in the neck, back, shoulder, knees, or hips. No arthritis, swelling, or gout. NEUROLOGIC: She had some numbness in her right hand which is now resolved and slurred speech which has resolved. No weakness, dysarthria, epilepsy, tremor, vertigo, or ataxia.  PSYCH: No anxiety, insomnia, ADD, OCD, bipolar, or depression.Marland Kitchen   PHYSICAL EXAMINATION:   VITAL SIGNS: Temperature 98.3, heart rate 88, respiratory rate 18, blood pressure 140/75, and saturation 95% on room air.   GENERAL: The patient is well developed, well-nourished, in no apparent distress, alert and oriented x3. She is slightly  cachectic.   HEENT: Pupils equal and reactive to light and accommodation. Extraocular movements are intact. Anicteric sclerae. No difficulty hearing. Oropharynx is clear. No JVD. No thyromegaly. No lymphadenopathy. No  carotid bruits. No conjunctivitis.  RESPIRATORY: Lungs are clear to auscultation. No adventitious breath sounds. No increased work of breathing or increased effort.   CARDIOVASCULAR: Regular rate and rhythm. Normal S1 and S2. No murmurs, rubs, or gallops.   EXTREMITIES: No lower extremity edema. 2+ dorsalis pedis pulses.   BREASTS: Deferred.   ABDOMEN: Soft, nontender, and nondistended. Positive bowel sounds.   GU: Deferred.   MUSCULOSKELETAL: Strength 5/5.  No clubbing, cyanosis or degenerative joint disease.   SKIN: No rashes, lesions, or induration. Warm to touch.   LYMPH: No lymphadenopathy in the cervical or supraclavicular area.   NEUROLOGICAL: Cranial nerves II through XII grossly intact.  +2 reflexes. Strength 5/5. Visual fields are intact. She is able to do finger-nose testing. Gait steady.   PSYCH: Alert and oriented x3.   LABS/STUDIES: Glucose 103, BUN 26, creatinine 0.90, serum sodium 137, potassium 3.7, chloride 103, bicarbonate 21, anion gap 13, calcium 9.4, total protein 7.4, albumin 3.6, total bilirubin 0.3, alkaline phosphatase 61, AST 22, and ALT 24. CK 33, MB 0.7, and troponin less than 0.02. White count 11.1, hematocrit 11.9, hemoglobin 35.6, and platelets 221.   Urinalysis showed trace bacteria, WBCs 10.   Head CT showed no acute intracranial process.  MRI of the brain showed no acute intracranial pathology.   ASSESSMENT AND RECOMMENDATIONS: This is an 81 year old white female with past medical history significant for transient ischemic attacks, strokes, hypertension, and hyperlipidemia.  1. Transient ischemic attack, cerebrovascular accident: She probably had a transient ischemic attack and is on Plavix and statin. I would continue this. The patient does not want to stay. I have spoken on numerous occasions to her son who also does not want the patient to stay. We will set up physical therapy at her assisted living.  2. Hypertension: She is fairly well  controlled.  3. Chronic obstructive pulmonary disease: Continue inhalers.  4. Hyperlipidemia: Continue statin.  5. Anemia: This seems to be stable. 6. Hyperglycemia and leukocytosis: Most likely stress reaction.   MEDICATIONS: (The patient was discharged from the ER on the following medications) 1. Simvastatin 40 mg daily.  2. Theophylline 300 mg daily. 3. Theophylline 200 mg nightly. 4. Albuterol HFA 2 puffs every 4 to 6 hours p.r.n. as needed.  5. Spiriva 18 mcg inhaled daily.  6. Evista 60 mg every day. 7. Celexa 7.5 mg every eight hours. 8. Tylenol 500 mg two capsules every six hours needed for pain.  9. Plavix 75 mg daily.  10. Ranitidine 150 mg twice a day. 11. Senna Plus 50/8.6 mg 2 tablets once a day. 12. Meclizine 25 mg every six hours p.r.n.  13. Nucynta 50 mg every six hours p.r.n. pain. 14. Milk of Magnesia 30 mL once a day at bedtime p.r.n.   DIET: Low sodium, low fat diet.   ACTIVITY: As tolerated.   DISCHARGE FOLLOWUP: The patient should followup with Dr. Einar CrowMarshall Anderson in one week. She should continue physical therapy 5 times a week.  .  CODE STATUS: FULL CODE.    TOTAL TIME SPENT ON DISCHARGE: Two hours. I have had numerous discussions today with patient, the patient's son, and the ER physician.   Thank you for allowing me to participate in the care of this patient.  ____________________________ Corie ChiquitoAmir A. Lafayette DragonFirozvi, MD aaf:drc D: 07/21/2011 14:15:07  ET T: 07/21/2011 14:42:54 ET JOB#: [id number]  cc: Karolee Ohs A. Lafayette Dragon, MD, <Dictator> Marya Amsler. Dareen Piano, MD  Coralyn Pear MD ELECTRONICALLY SIGNED 07/21/2011 16:48

## 2014-11-08 NOTE — Consult Note (Signed)
PATIENT NAME:  Peggy ConteHUDSON, Winnell E MR#:  [id number] DATE OF BIRTH:  May 22, 1925  DATE OF CONSULTATION:  07/21/2011  REFERRING PHYSICIAN:  Bayard Malesandolph Brown, MD CONSULTING PHYSICIAN:  Corie ChiquitoAmir A. Lafayette DragonFirozvi, MD  ADDENDUM:  DRUG ALLERGIES: Sulfa drugs.   FAMILY HISTORY: Her father died of diabetes and heart failure. Her mother had a heart attack.   SOCIAL HISTORY: The patient was a heavy smoker and now does not smoke. She has a 40 pack-year history. No alcohol or IV drug use. [place].  REVIEW OF SYSTEMS:  CONSTITUTIONAL:  No fever, fatigue, or weakness. EYES: No blurred vision, double vision, pain, redness, inflammation, or glaucoma. ENT: No tinnitus, ear pain, hearing loss, seasonal allergies, epistaxis, or discharge. RESPIRATORY: No cough, wheezing, hemoptysis, dyspnea, asthma, or painful respirations. CARDIOVASCULAR: No chest pain, orthopnea, edema, arrhythmias, dyspnea on exertion, palpitations, or syncope. GI: No nausea, vomiting, diarrhea, abdominal pain, hematemesis, melena, or gastroesophageal reflux disease. GU: No dysuria, hematuria, renal calculi, increased frequency, or incontinence. GYN: No breast mass, tenderness, or vaginal discharge. ENDOCRINE: No polyuria, nocturia, thyroid problems, increased sweating, heat or cold intolerance. HEME/LYMPH: No anemia, easy bruising, bleeding, or swollen glands. INTEGUMENT: No acne, rash lesions, or change in mole, hair or skin. MUSCULOSKELETAL: No pain in the neck, back, shoulder, knees, or hips. No arthritis, swelling, or gout. NEUROLOGIC: She had some numbness in her right hand which is now resolved and slurred speech which has resolved. No weakness, dysarthria, epilepsy, tremor, vertigo, or ataxia.  PSYCH: No anxiety, insomnia, ADD, OCD, bipolar, or depression.Marland Kitchen.   PHYSICAL EXAMINATION:   VITAL SIGNS: Temperature 98.3, heart rate 88, respiratory rate 18, blood pressure 140/75, and saturation 95% on room air.    GENERAL: The patient is well developed, well-nourished, in no apparent distress, alert and oriented x3. She is slightly cachectic.   HEENT: Pupils equal and reactive to light and accommodation. Extraocular movements are intact. Anicteric sclerae. No difficulty hearing. Oropharynx is clear. No JVD. No thyromegaly. No lymphadenopathy. No carotid bruits. No conjunctivitis.  RESPIRATORY: Lungs are clear to auscultation. No adventitious breath sounds. No increased work of breathing or increased effort.   CARDIOVASCULAR: Regular rate and rhythm. Normal S1 and S2. No murmurs, rubs, or gallops.   EXTREMITIES: No lower extremity edema. 2+ dorsalis pedis pulses.   BREASTS: Deferred.   ABDOMEN: Soft, nontender, and nondistended. Positive bowel sounds.   GU: Deferred.   MUSCULOSKELETAL: Strength 5/5.  No clubbing, cyanosis or degenerative joint disease.   SKIN: No rashes, lesions, or induration. Warm to touch.   LYMPH: No lymphadenopathy in the cervical or supraclavicular area.   NEUROLOGICAL: Cranial nerves II through XII grossly intact.  +2 reflexes. Strength 5/5. Visual fields are intact. She is able to do finger-nose testing. Gait steady.   PSYCH: Alert and oriented x3.   LABS/STUDIES: Glucose 103, BUN 26, creatinine 0.90, serum sodium 137, potassium 3.7, chloride 103, bicarbonate 21, anion gap 13, calcium 9.4, total protein 7.4, albumin 3.6, total bilirubin 0.3, alkaline phosphatase 61, AST 22, and ALT 24. CK 33, MB 0.7, and troponin less than 0.02. White count 11.1, hematocrit 11.9, hemoglobin 35.6, and platelets 221.   Urinalysis showed trace bacteria, WBCs 10.   Head CT showed no acute intracranial process.  MRI of the brain showed no acute intracranial pathology.   ASSESSMENT AND RECOMMENDATIONS: This is an 81 year old white female with past medical history significant for transient ischemic attacks, strokes, hypertension, and hyperlipidemia.  1. Transient ischemic attack,  cerebrovascular accident: She probably had a transient ischemic attack and is on Plavix and statin. I would continue this. The patient does not want to stay. I have spoken on numerous occasions to her son who also does not want the patient to stay. We will set up physical therapy at her assisted living.  2. Hypertension: She is fairly well controlled.  3. Chronic obstructive pulmonary disease: Continue inhalers.  4. Hyperlipidemia: Continue statin.  5. Anemia: This seems to be stable. 6. Hyperglycemia and leukocytosis: Most likely stress reaction.   MEDICATIONS: (The patient was discharged from the ER on the following medications) 1. Simvastatin 40 mg daily.  2. Theophylline 300 mg daily. 3. Theophylline 200 mg nightly. 4. Albuterol HFA 2 puffs every 4 to 6 hours p.r.n. as needed.  5. Spiriva 18 mcg inhaled daily.  6. Evista 60 mg every day. 7. Celexa 7.5 mg every eight hours. 8. Tylenol 500 mg two capsules every six hours needed for pain.  9. Plavix 75 mg daily.  10. Ranitidine 150 mg twice a day. 11. Senna Plus 50/8.6 mg 2 tablets once a day. 12. Meclizine 25 mg every six hours p.r.n.  13. Nucynta 50 mg every six hours p.r.n. pain. 14. Milk of Magnesia 30 mL once a day at bedtime p.r.n.   DIET: Low sodium, low fat diet.   ACTIVITY: As tolerated.   DISCHARGE FOLLOWUP: The patient should followup with Dr. Einar Crow in one week. She should continue physical therapy 5 times a week.  Thank you for allowing me to participate in the care of this patient.   CODE STATUS: FULL CODE.   TIME SPENT ON DISCHARGE: 2 hours. ____________________________ Corie Chiquito Lafayette Dragon, MD aaf:slb D: 07/21/2011 14:29:00 ET T: 07/21/2011 15:01:35 ET JOB#: [id number]  cc: Marya Amsler. Dareen Piano, MD Corie Chiquito. Lafayette Dragon, MD, <Dictator>  Coralyn Pear MD ELECTRONICALLY SIGNED 07/25/2011 14:41

## 2014-11-08 NOTE — Discharge Summary (Signed)
PATIENT NAME:  Peggy ConteHUDSON, Alveda E MR#:  [id number] DATE OF BIRTH:  1925/06/05  DATE OF ADMISSION:  07/20/2011 DATE OF DISCHARGE:    REFERRING PHYSICIAN:  Bayard Malesandolph Brown, MD   PRIMARY CARE PHYSICIAN: Einar CrowMarshall Anderson, MD   REASON FOR CONSULTATION: TIA, right arm numbness.   HISTORY OF PRESENT ILLNESS: This is an 81 year old white female with past medical history of recent stroke who had some slurred speech and right hand numbness which resolved fairly quickly. The patient states that this began yesterday and resolved in several hours. She had no chest pain or shortness of breath. No nausea, vomiting, diarrhea, fevers, chills, or shakes. No visual disturbances or hearing disturbance, or headache. She said she was [facility] and started on Plavix for acute stroke. She did not want to be admitted, spoke to her son on several occasions today. We did MRI in addition to CT scan which showed no acute intracranial abnormality, subsequently, the patient was discharged from the ER with outpatient physical therapy.   PAST MEDICAL HISTORY:  1. CVA, acute, x2, last in 05/2011. 2. Chronic obstructive pulmonary disease, oxygen dependent. 3. Hyperlipidemia.  4. Osteoporosis.  5. Gastroesophageal reflux disease.   MEDICATIONS AT HOME:  1. Simvastatin 40 mg daily.  2. Theophylline 200 mg every day at bedtime.  3. Albuterol inhaler 2 puffs q.4 to 6 hours p.r.n.  4. Spiriva 18 mcg every day. 5. Evista 60 mg every day. 6. Flexeril 7.5 mg every eight hours p.r.n.  7. Tylenol 500 mg every six hours p.r.n. pain.  8. Plavix 75 mg daily.  9. Ranitidine 150 mg 2 times a day. 10. Senna Plus 50/8.6 2 tablets once a day. 11. Meclizine 25 mg every six hours as needed. 12. Nucynta 50 mg q.6 hours p.r.n. as needed for pain. 13. Milk of Magnesia 30 mL once a day as needed.   DIET: Low sodium, low fat diet.   ACTIVITY: As tolerated. PT 5 times a week.  FOLLOW-UP: The patient is to  follow-up with Dr. Dareen PianoAnderson in one week's time.   CODE STATUS: FULL CODE.   TOTAL TIME SPENT ON DISCHARGE: Two hours. I have had numerous discussions today with patient, the patient's son, and the ER physician.   Thank you for allowing me to participate in the care of this patient.  ____________________________ Corie ChiquitoAmir A. Lafayette DragonFirozvi, MD aaf:drc D: 07/21/2011 14:15:07 ET T: 07/21/2011 14:42:54 ET JOB#: [id number]  cc: Karolee OhsAmir A. Lafayette DragonFirozvi, MD, <Dictator> Marya AmslerMarshall W. Dareen PianoAnderson, MD

## 2014-11-08 NOTE — Consult Note (Signed)
PATIENT NAME:  Peggy Stone, BEIDLER MR#:  [id number] DATE OF BIRTH:  31-Jan-1925  DATE OF CONSULTATION:  04/19/2012  REFERRING PHYSICIAN:   CONSULTING PHYSICIAN:  Tallis Soledad A. Lafayette Dragon, MD  ADDENDUM  DRUG ALLERGIES: Sulfa drugs.   FAMILY HISTORY: Her father died of diabetes and heart failure. Her mother had a heart attack.   SOCIAL HISTORY: The patient was a heavy smoker and now does not smoke. She has a 40 pack-year history. No alcohol or IV drug use. She lives in the [place], in assisted living.  REVIEW OF SYSTEMS:  CONSTITUTIONAL:  No fever, fatigue, or weakness. EYES: No blurred vision, double vision, pain, redness, inflammation, or glaucoma. ENT: No tinnitus, ear pain, hearing loss, seasonal allergies, epistaxis, or discharge. RESPIRATORY: No cough, wheezing, hemoptysis, dyspnea, asthma, or painful respirations. CARDIOVASCULAR: No chest pain, orthopnea, edema, arrhythmias, dyspnea on exertion, palpitations, or syncope. GI: No nausea, vomiting, diarrhea, abdominal pain, hematemesis, melena, or gastroesophageal reflux disease. GU: No dysuria, hematuria, renal calculi, increased frequency, or incontinence. GYN: No breast mass, tenderness, or vaginal discharge. ENDOCRINE: No polyuria, nocturia, thyroid problems, increased sweating, heat or cold intolerance. HEME/LYMPH: No anemia, easy bruising, bleeding, or swollen glands. INTEGUMENT: No acne, rash lesions, or change in mole, hair or skin. MUSCULOSKELETAL: No pain in the neck, back, shoulder, knees, or hips. No arthritis, swelling, or gout. NEUROLOGIC: She had some numbness in her right hand which is now resolved and slurred speech which has resolved. No weakness, dysarthria, epilepsy, tremor, vertigo, or ataxia.  PSYCH: No anxiety, insomnia, ADD, OCD, bipolar, or depression.Marland Kitchen   PHYSICAL EXAMINATION:   VITAL SIGNS: Temperature 98.3, heart rate 88, respiratory rate 18, blood pressure 140/75, and saturation 95% on room air.   GENERAL: The patient  is well developed, well-nourished, in no apparent distress, alert and oriented x3. She is slightly cachectic.   HEENT: Pupils equal and reactive to light and accommodation. Extraocular movements are intact. Anicteric sclerae. No difficulty hearing. Oropharynx is clear. No JVD. No thyromegaly. No lymphadenopathy. No carotid bruits. No conjunctivitis.  RESPIRATORY: Lungs are clear to auscultation. No adventitious breath sounds. No increased work of breathing or increased effort.   CARDIOVASCULAR: Regular rate and rhythm. Normal S1 and S2. No murmurs, rubs, or gallops.   EXTREMITIES: No lower extremity edema. 2+ dorsalis pedis pulses.   BREASTS: Deferred.   ABDOMEN: Soft, nontender, and nondistended. Positive bowel sounds.   GU: Deferred.   MUSCULOSKELETAL: Strength 5/5.  No clubbing, cyanosis or degenerative joint disease.   SKIN: No rashes, lesions, or induration. Warm to touch.   LYMPH: No lymphadenopathy in the cervical or supraclavicular area.   NEUROLOGICAL: Cranial nerves II through XII grossly intact.  +2 reflexes. Strength 5/5. Visual fields are intact. She is able to do finger-nose testing. Gait steady.   PSYCH: Alert and oriented x3.   LABS/STUDIES: Glucose 103, BUN 26, creatinine 0.90, serum sodium 137, potassium 3.7, chloride 103, bicarbonate 21, anion gap 13, calcium 9.4, total protein 7.4, albumin 3.6, total bilirubin 0.3, alkaline phosphatase 61, AST 22, and ALT 24. CK 33, MB 0.7, and troponin less than 0.02. White count 11.1, hematocrit 11.9, hemoglobin 35.6, and platelets 221.   Urinalysis showed trace bacteria, WBCs 10.   Head CT showed no acute intracranial process.  MRI of the brain showed no acute intracranial pathology.   ASSESSMENT AND RECOMMENDATIONS: This is an 81 year old white female with past medical history significant for transient ischemic attacks, strokes, hypertension, and hyperlipidemia.  1. Transient ischemic attack, cerebrovascular accident:  She  probably had a transient ischemic attack and is on Plavix and statin. I would continue this. The patient does not want to stay. I have spoken on numerous occasions to her son who also does not want the patient to stay. We will set up physical therapy at her assisted living.  2. Hypertension: She is fairly well controlled.  3. Chronic obstructive pulmonary disease: Continue inhalers.  4. Hyperlipidemia: Continue statin.  5. Anemia: This seems to be stable. 6. Hyperglycemia and leukocytosis: Most likely stress reaction.   MEDICATIONS: (The patient was discharged from the ER on the following medications) 1. Simvastatin 40 mg daily.  2. Theophylline 300 mg daily. 3. Theophylline 200 mg nightly. 4. Albuterol HFA 2 puffs every 4 to 6 hours p.r.n. as needed.  5. Spiriva 18 mcg inhaled daily.  6. Evista 60 mg every day. 7. Celexa 7.5 mg every eight hours. 8. Tylenol 500 mg two capsules every six hours needed for pain.  9. Plavix 75 mg daily.  10. Ranitidine 150 mg twice a day. 11. Senna Plus 50/8.6 mg 2 tablets once a day. 12. Meclizine 25 mg every six hours p.r.n.  13. Nucynta 50 mg every six hours p.r.n. pain. 14. Milk of Magnesia 30 mL once a day at bedtime p.r.n.   DIET: Low sodium, low fat diet.   ACTIVITY: As tolerated.   DISCHARGE FOLLOWUP: The patient should followup with Dr. Einar CrowMarshall Anderson in one week. She should continue physical therapy 5 times a week.  Thank you for allowing me to participate in the care of this patient.   CODE STATUS: FULL CODE.   TIME SPENT ON DISCHARGE: 2 hours. ____________________________ Corie ChiquitoAmir A. Lafayette DragonFirozvi, MD aaf:slb D: 07/21/2011 14:29:45 ET T: 07/21/2011 15:01:35 ET JOB#: [id number]  cc: Karolee OhsAmir A. Lafayette DragonFirozvi, MD, <Dictator> Marya AmslerMarshall W. Dareen PianoAnderson, MD

## 2014-12-16 ENCOUNTER — Encounter
Admission: RE | Admit: 2014-12-16 | Discharge: 2014-12-16 | Disposition: A | Discharge status: Home / Self Care | Payer: Medicare Other | Source: Ambulatory Visit | Attending: Internal Medicine | Admitting: Internal Medicine

## 2015-01-15 ENCOUNTER — Encounter
Admission: RE | Admit: 2015-01-15 | Discharge: 2015-01-15 | Disposition: A | Discharge status: Home / Self Care | Payer: Medicare Other | Source: Ambulatory Visit | Attending: Internal Medicine | Admitting: Internal Medicine

## 2015-01-15 DIAGNOSIS — I10 Essential (primary) hypertension: Secondary | ICD-10-CM | POA: Insufficient documentation

## 2015-01-15 DIAGNOSIS — D649 Anemia, unspecified: Secondary | ICD-10-CM | POA: Insufficient documentation

## 2015-01-28 DIAGNOSIS — D649 Anemia, unspecified: Secondary | ICD-10-CM | POA: Diagnosis present

## 2015-01-28 DIAGNOSIS — I10 Essential (primary) hypertension: Secondary | ICD-10-CM | POA: Diagnosis not present

## 2015-01-28 LAB — CBC WITH DIFFERENTIAL/PLATELET
BASOS PCT: 1 %
Basophils Absolute: 0.1 10*3/uL (ref 0–0.1)
EOS ABS: 0.1 10*3/uL (ref 0–0.7)
Eosinophils Relative: 1 %
HEMATOCRIT: 27.7 % — AB (ref 35.0–47.0)
Hemoglobin: 8.8 g/dL — ABNORMAL LOW (ref 12.0–16.0)
Lymphocytes Relative: 17 %
Lymphs Abs: 2.1 10*3/uL (ref 1.0–3.6)
MCH: 30.8 pg (ref 26.0–34.0)
MCHC: 31.9 g/dL — ABNORMAL LOW (ref 32.0–36.0)
MCV: 96.6 fL (ref 80.0–100.0)
MONO ABS: 1 10*3/uL — AB (ref 0.2–0.9)
Monocytes Relative: 8 %
NEUTROS PCT: 73 %
Neutro Abs: 9 10*3/uL — ABNORMAL HIGH (ref 1.4–6.5)
Platelets: 277 10*3/uL (ref 150–440)
RBC: 2.87 MIL/uL — AB (ref 3.80–5.20)
RDW: 16.2 % — AB (ref 11.5–14.5)
WBC: 12.3 10*3/uL — AB (ref 3.6–11.0)

## 2015-01-28 LAB — BASIC METABOLIC PANEL
Anion gap: 7 (ref 5–15)
BUN: 33 mg/dL — ABNORMAL HIGH (ref 6–20)
CHLORIDE: 109 mmol/L (ref 101–111)
CO2: 25 mmol/L (ref 22–32)
CREATININE: 1.05 mg/dL — AB (ref 0.44–1.00)
Calcium: 9.4 mg/dL (ref 8.9–10.3)
GFR calc Af Amer: 53 mL/min — ABNORMAL LOW (ref >60)
GFR calc non Af Amer: 46 mL/min — ABNORMAL LOW (ref >60)
Glucose, Bld: 78 mg/dL (ref 65–99)
Potassium: 4.1 mmol/L (ref 3.5–5.1)
Sodium: 141 mmol/L (ref 135–145)

## 2015-02-15 ENCOUNTER — Encounter
Admission: RE | Admit: 2015-02-15 | Discharge: 2015-02-15 | Disposition: A | Discharge status: Home / Self Care | Payer: Medicare Other | Source: Ambulatory Visit | Attending: Internal Medicine | Admitting: Internal Medicine

## 2015-02-22 ENCOUNTER — Other Ambulatory Visit
Admission: RE | Admit: 2015-02-22 | Discharge: 2015-02-22 | Disposition: A | Discharge status: Home / Self Care | Source: Ambulatory Visit | Attending: Gerontology | Admitting: Gerontology

## 2015-02-22 DIAGNOSIS — J189 Pneumonia, unspecified organism: Secondary | ICD-10-CM | POA: Diagnosis not present

## 2015-02-22 LAB — COMPREHENSIVE METABOLIC PANEL
ALT: 16 U/L (ref 14–54)
AST: 34 U/L (ref 15–41)
Albumin: 3.2 g/dL — ABNORMAL LOW (ref 3.5–5.0)
Alkaline Phosphatase: 33 U/L — ABNORMAL LOW (ref 38–126)
Anion gap: 8 (ref 5–15)
BUN: 54 mg/dL — AB (ref 6–20)
CO2: 23 mmol/L (ref 22–32)
CREATININE: 2.42 mg/dL — AB (ref 0.44–1.00)
Calcium: 9.1 mg/dL (ref 8.9–10.3)
Chloride: 101 mmol/L (ref 101–111)
GFR calc non Af Amer: 17 mL/min — ABNORMAL LOW (ref >60)
GFR, EST AFRICAN AMERICAN: 19 mL/min — AB (ref >60)
GLUCOSE: 95 mg/dL (ref 65–99)
POTASSIUM: 5.1 mmol/L (ref 3.5–5.1)
SODIUM: 132 mmol/L — AB (ref 135–145)
Total Bilirubin: <0.1 mg/dL — ABNORMAL LOW (ref 0.3–1.2)
Total Protein: 6.6 g/dL (ref 6.5–8.1)

## 2015-02-22 LAB — CBC WITH DIFFERENTIAL/PLATELET
BASOS ABS: 0 10*3/uL (ref 0–0.1)
BASOS PCT: 0 %
EOS ABS: 0.2 10*3/uL (ref 0–0.7)
EOS PCT: 1 %
HCT: 25.9 % — ABNORMAL LOW (ref 35.0–47.0)
Hemoglobin: 8.2 g/dL — ABNORMAL LOW (ref 12.0–16.0)
Lymphocytes Relative: 10 %
Lymphs Abs: 1.7 10*3/uL (ref 1.0–3.6)
MCH: 30.2 pg (ref 26.0–34.0)
MCHC: 31.8 g/dL — ABNORMAL LOW (ref 32.0–36.0)
MCV: 94.9 fL (ref 80.0–100.0)
MONO ABS: 1 10*3/uL — AB (ref 0.2–0.9)
MONOS PCT: 6 %
Neutro Abs: 14.2 10*3/uL — ABNORMAL HIGH (ref 1.4–6.5)
Neutrophils Relative %: 83 %
Platelets: 384 10*3/uL (ref 150–440)
RBC: 2.73 MIL/uL — ABNORMAL LOW (ref 3.80–5.20)
RDW: 16 % — ABNORMAL HIGH (ref 11.5–14.5)
WBC: 17.1 10*3/uL — AB (ref 3.6–11.0)

## 2015-03-18 ENCOUNTER — Encounter
Admission: RE | Admit: 2015-03-18 | Discharge: 2015-03-18 | Disposition: A | Discharge status: Home / Self Care | Source: Ambulatory Visit | Attending: Internal Medicine | Admitting: Internal Medicine

## 2015-04-01 ENCOUNTER — Other Ambulatory Visit
Admission: RE | Admit: 2015-04-01 | Discharge: 2015-04-01 | Disposition: A | Discharge status: Home / Self Care | Source: Other Acute Inpatient Hospital | Attending: Gerontology | Admitting: Gerontology

## 2015-04-01 DIAGNOSIS — R112 Nausea with vomiting, unspecified: Secondary | ICD-10-CM | POA: Insufficient documentation

## 2015-04-01 LAB — COMPREHENSIVE METABOLIC PANEL
ALK PHOS: 63 U/L (ref 38–126)
ALT: 39 U/L (ref 14–54)
ANION GAP: 18 — AB (ref 5–15)
AST: 105 U/L — ABNORMAL HIGH (ref 15–41)
Albumin: 4 g/dL (ref 3.5–5.0)
BILIRUBIN TOTAL: 0.7 mg/dL (ref 0.3–1.2)
BUN: 56 mg/dL — ABNORMAL HIGH (ref 6–20)
CALCIUM: 10.1 mg/dL (ref 8.9–10.3)
CO2: 21 mmol/L — ABNORMAL LOW (ref 22–32)
CREATININE: 1.75 mg/dL — AB (ref 0.44–1.00)
Chloride: 113 mmol/L — ABNORMAL HIGH (ref 101–111)
GFR, EST AFRICAN AMERICAN: 29 mL/min — AB (ref >60)
GFR, EST NON AFRICAN AMERICAN: 25 mL/min — AB (ref >60)
Glucose, Bld: 161 mg/dL — ABNORMAL HIGH (ref 65–99)
Potassium: 4.1 mmol/L (ref 3.5–5.1)
Sodium: 152 mmol/L — ABNORMAL HIGH (ref 135–145)
TOTAL PROTEIN: 8 g/dL (ref 6.5–8.1)

## 2015-04-01 LAB — CBC WITH DIFFERENTIAL/PLATELET
Basophils Absolute: 0.2 10*3/uL — ABNORMAL HIGH (ref 0–0.1)
Basophils Relative: 1 %
EOS ABS: 0 10*3/uL (ref 0–0.7)
Eosinophils Relative: 0 %
HCT: 34.7 % — ABNORMAL LOW (ref 35.0–47.0)
Hemoglobin: 10.5 g/dL — ABNORMAL LOW (ref 12.0–16.0)
LYMPHS ABS: 3 10*3/uL (ref 1.0–3.6)
MCH: 30.7 pg (ref 26.0–34.0)
MCHC: 30.4 g/dL — ABNORMAL LOW (ref 32.0–36.0)
MCV: 101.2 fL — AB (ref 80.0–100.0)
Monocytes Absolute: 2 10*3/uL — ABNORMAL HIGH (ref 0.2–0.9)
Neutro Abs: 26.6 10*3/uL — ABNORMAL HIGH (ref 1.4–6.5)
Platelets: 751 10*3/uL — ABNORMAL HIGH (ref 150–440)
RBC: 3.43 MIL/uL — AB (ref 3.80–5.20)
RDW: 19.3 % — ABNORMAL HIGH (ref 11.5–14.5)
WBC: 31.8 10*3/uL — AB (ref 3.6–11.0)

## 2015-04-17 DEATH — deceased
